# Patient Record
Sex: Male | Born: 1965 | Race: White | Marital: Married | Smoking: Former smoker
Health system: Southern US, Community
[De-identification: ages and names within clinical notes are randomized; demographics above are authoritative.]

## PROBLEM LIST (undated history)

## (undated) DIAGNOSIS — B20 Human immunodeficiency virus [HIV] disease: Principal | ICD-10-CM

## (undated) DIAGNOSIS — M545 Low back pain, unspecified: Secondary | ICD-10-CM

## (undated) DIAGNOSIS — Z789 Other specified health status: Secondary | ICD-10-CM

## (undated) DIAGNOSIS — R85610 Atypical squamous cells of undetermined significance on cytologic smear of anus (ASC-US): Secondary | ICD-10-CM

## (undated) DIAGNOSIS — Z79899 Other long term (current) drug therapy: Secondary | ICD-10-CM

## (undated) DIAGNOSIS — R195 Other fecal abnormalities: Principal | ICD-10-CM

## (undated) DIAGNOSIS — G4700 Insomnia, unspecified: Secondary | ICD-10-CM

## (undated) DIAGNOSIS — Z113 Encounter for screening for infections with a predominantly sexual mode of transmission: Secondary | ICD-10-CM

## (undated) DIAGNOSIS — N486 Induration penis plastica: Secondary | ICD-10-CM

## (undated) DIAGNOSIS — R0981 Nasal congestion: Secondary | ICD-10-CM

## (undated) DIAGNOSIS — M25532 Pain in left wrist: Principal | ICD-10-CM

## (undated) DIAGNOSIS — M79602 Pain in left arm: Secondary | ICD-10-CM

## (undated) DIAGNOSIS — R42 Dizziness and giddiness: Secondary | ICD-10-CM

## (undated) DIAGNOSIS — N529 Male erectile dysfunction, unspecified: Secondary | ICD-10-CM

## (undated) HISTORY — DX: Pain in left wrist: M25.532

## (undated) HISTORY — DX: Insomnia, unspecified: G47.00

## (undated) HISTORY — DX: Atypical squamous cells of undetermined significance on cytologic smear of anus (ASC-US): R85.610

## (undated) HISTORY — DX: Low back pain: M54.5

## (undated) HISTORY — DX: Pain in left arm: M79.602

## (undated) HISTORY — DX: Dizziness and giddiness: R42

## (undated) HISTORY — DX: Other long term (current) drug therapy: Z79.899

## (undated) HISTORY — DX: Other fecal abnormalities: R19.5

## (undated) HISTORY — DX: Human immunodeficiency virus (HIV) disease: B20

## (undated) HISTORY — DX: Other specified health status: Z78.9

## (undated) HISTORY — DX: Nasal congestion: R09.81

## (undated) HISTORY — DX: Encounter for screening for infections with a predominantly sexual mode of transmission: Z11.3

## (undated) HISTORY — DX: Induration penis plastica: N48.6

## (undated) HISTORY — DX: Male erectile dysfunction, unspecified: N52.9

## (undated) HISTORY — DX: Low back pain, unspecified: M54.50

## (undated) MED FILL — Bictegravir-Emtricitabine-Tenofovir AF Tab 50-200-25 MG: ORAL | Fill #0 | Status: CN

---

## 1994-06-16 DIAGNOSIS — B20 Human immunodeficiency virus [HIV] disease: Secondary | ICD-10-CM | POA: Insufficient documentation

## 2014-03-01 DIAGNOSIS — K6282 Dysplasia of anus: Secondary | ICD-10-CM | POA: Insufficient documentation

## 2014-09-15 HISTORY — PX: OTHER SURGICAL HISTORY: SHX169

## 2015-05-29 ENCOUNTER — Other Ambulatory Visit: Payer: Self-pay

## 2015-05-29 DIAGNOSIS — B20 Human immunodeficiency virus [HIV] disease: Secondary | ICD-10-CM

## 2015-06-12 ENCOUNTER — Other Ambulatory Visit (INDEPENDENT_AMBULATORY_CARE_PROVIDER_SITE_OTHER): Payer: Self-pay

## 2015-06-12 DIAGNOSIS — Z79899 Other long term (current) drug therapy: Secondary | ICD-10-CM

## 2015-06-12 DIAGNOSIS — Z113 Encounter for screening for infections with a predominantly sexual mode of transmission: Secondary | ICD-10-CM

## 2015-06-12 DIAGNOSIS — B20 Human immunodeficiency virus [HIV] disease: Secondary | ICD-10-CM

## 2015-06-12 LAB — COMPLETE METABOLIC PANEL WITH GFR
ALBUMIN: 4.7 g/dL (ref 3.6–5.1)
ALK PHOS: 64 U/L (ref 40–115)
ALT: 29 U/L (ref 9–46)
AST: 22 U/L (ref 10–40)
BILIRUBIN TOTAL: 0.5 mg/dL (ref 0.2–1.2)
BUN: 16 mg/dL (ref 7–25)
CALCIUM: 10.2 mg/dL (ref 8.6–10.3)
CO2: 27 mmol/L (ref 20–31)
CREATININE: 1.16 mg/dL (ref 0.60–1.35)
Chloride: 105 mmol/L (ref 98–110)
GFR, EST AFRICAN AMERICAN: 85 mL/min (ref >=60)
GFR, EST NON AFRICAN AMERICAN: 74 mL/min (ref >=60)
Glucose, Bld: 85 mg/dL (ref 65–99)
Potassium: 4.9 mmol/L (ref 3.5–5.3)
Sodium: 141 mmol/L (ref 135–146)
TOTAL PROTEIN: 7.4 g/dL (ref 6.1–8.1)

## 2015-06-12 LAB — CBC WITH DIFFERENTIAL/PLATELET
BASOS ABS: 0.1 10*3/uL (ref 0.0–0.1)
Basophils Relative: 1 % (ref 0–1)
EOS ABS: 0.3 10*3/uL (ref 0.0–0.7)
Eosinophils Relative: 5 % (ref 0–5)
HEMATOCRIT: 50.3 % (ref 39.0–52.0)
Hemoglobin: 17.3 g/dL — ABNORMAL HIGH (ref 13.0–17.0)
LYMPHS ABS: 1.8 10*3/uL (ref 0.7–4.0)
LYMPHS PCT: 32 % (ref 12–46)
MCH: 30.9 pg (ref 26.0–34.0)
MCHC: 34.4 g/dL (ref 30.0–36.0)
MCV: 90 fL (ref 78.0–100.0)
MONOS PCT: 8 % (ref 3–12)
MPV: 9.6 fL (ref 8.6–12.4)
Monocytes Absolute: 0.4 10*3/uL (ref 0.1–1.0)
NEUTROS PCT: 54 % (ref 43–77)
Neutro Abs: 3 10*3/uL (ref 1.7–7.7)
Platelets: 287 10*3/uL (ref 150–400)
RBC: 5.59 MIL/uL (ref 4.22–5.81)
RDW: 13.2 % (ref 11.5–15.5)
WBC: 5.5 10*3/uL (ref 4.0–10.5)

## 2015-06-12 LAB — LIPID PANEL
CHOLESTEROL: 181 mg/dL (ref 125–200)
HDL: 43 mg/dL (ref >=40)
LDL CALC: 93 mg/dL (ref <130)
TRIGLYCERIDES: 224 mg/dL — AB (ref <150)
Total CHOL/HDL Ratio: 4.2 Ratio (ref <=5.0)
VLDL: 45 mg/dL — ABNORMAL HIGH (ref <30)

## 2015-06-13 LAB — URINALYSIS
Bilirubin Urine: NEGATIVE
Glucose, UA: NEGATIVE
Hgb urine dipstick: NEGATIVE
Ketones, ur: NEGATIVE
LEUKOCYTES UA: NEGATIVE
NITRITE: NEGATIVE
PH: 6.5 (ref 5.0–8.0)
Protein, ur: NEGATIVE
SPECIFIC GRAVITY, URINE: 1.018 (ref 1.001–1.035)

## 2015-06-13 LAB — HEPATITIS B SURFACE ANTIGEN: Hepatitis B Surface Ag: NEGATIVE

## 2015-06-13 LAB — HEPATITIS B CORE ANTIBODY, TOTAL: Hep B Core Total Ab: NONREACTIVE

## 2015-06-13 LAB — HEPATITIS C ANTIBODY: HCV AB: NEGATIVE

## 2015-06-13 LAB — HIV-1 RNA ULTRAQUANT REFLEX TO GENTYP+
HIV 1 RNA Quant: 30 copies/mL — ABNORMAL HIGH (ref <20)
HIV-1 RNA Quant, Log: 1.48 Log copies/mL — ABNORMAL HIGH (ref <1.30)

## 2015-06-13 LAB — HEPATITIS A ANTIBODY, TOTAL: Hep A Total Ab: REACTIVE — AB

## 2015-06-13 LAB — SYPHILIS: RPR W/REFLEX TO RPR TITER AND TREPONEMAL ANTIBODIES, TRADITIONAL SCREENING AND DIAGNOSIS ALGORITHM

## 2015-06-13 LAB — T-HELPER CELL (CD4) - (RCID CLINIC ONLY)
CD4 % Helper T Cell: 38 % (ref 33–55)
CD4 T Cell Abs: 660 /uL (ref 400–2700)

## 2015-06-13 LAB — HEPATITIS B SURFACE ANTIBODY,QUALITATIVE: Hep B S Ab: POSITIVE — AB

## 2015-06-14 LAB — QUANTIFERON TB GOLD ASSAY (BLOOD)
Interferon Gamma Release Assay: POSITIVE — AB
Mitogen value: >10 IU/mL
QUANTIFERON TB AG MINUS NIL: 6.62 [IU]/mL
Quantiferon Nil Value: 0.03 IU/mL
TB AG VALUE: 6.65 [IU]/mL

## 2015-06-16 LAB — URINE CYTOLOGY ANCILLARY ONLY
Chlamydia: NEGATIVE
Neisseria Gonorrhea: NEGATIVE

## 2015-06-17 LAB — HLA B*5701: HLA-B*5701 w/rflx HLA-B High: NEGATIVE

## 2015-06-25 ENCOUNTER — Ambulatory Visit: Payer: Self-pay | Admitting: Infectious Disease

## 2015-07-03 ENCOUNTER — Encounter: Payer: Self-pay | Admitting: Infectious Disease

## 2015-07-03 ENCOUNTER — Ambulatory Visit (INDEPENDENT_AMBULATORY_CARE_PROVIDER_SITE_OTHER): Payer: Self-pay | Admitting: Infectious Disease

## 2015-07-03 ENCOUNTER — Encounter: Payer: Self-pay | Admitting: *Deleted

## 2015-07-03 VITALS — BP 145/94 | HR 94 | Temp 97.9°F | Ht 69.69 in | Wt 205.0 lb

## 2015-07-03 DIAGNOSIS — R896 Abnormal cytological findings in specimens from other organs, systems and tissues: Secondary | ICD-10-CM

## 2015-07-03 DIAGNOSIS — M545 Low back pain, unspecified: Secondary | ICD-10-CM | POA: Insufficient documentation

## 2015-07-03 DIAGNOSIS — B20 Human immunodeficiency virus [HIV] disease: Secondary | ICD-10-CM

## 2015-07-03 DIAGNOSIS — G8929 Other chronic pain: Secondary | ICD-10-CM

## 2015-07-03 DIAGNOSIS — M5440 Lumbago with sciatica, unspecified side: Secondary | ICD-10-CM

## 2015-07-03 DIAGNOSIS — G4700 Insomnia, unspecified: Secondary | ICD-10-CM | POA: Insufficient documentation

## 2015-07-03 DIAGNOSIS — R85610 Atypical squamous cells of undetermined significance on cytologic smear of anus (ASC-US): Secondary | ICD-10-CM

## 2015-07-03 DIAGNOSIS — N486 Induration penis plastica: Secondary | ICD-10-CM | POA: Insufficient documentation

## 2015-07-03 DIAGNOSIS — K6282 Dysplasia of anus: Secondary | ICD-10-CM

## 2015-07-03 HISTORY — DX: Human immunodeficiency virus (HIV) disease: B20

## 2015-07-03 HISTORY — DX: Atypical squamous cells of undetermined significance on cytologic smear of anus (ASC-US): R85.610

## 2015-07-03 MED ORDER — CYCLOBENZAPRINE HCL 10 MG PO TABS: 10.0000 mg | ORAL_TABLET | Freq: Every evening | ORAL | Status: DC | PRN

## 2015-07-03 MED ORDER — ELVITEG-COBIC-EMTRICIT-TENOFAF 150-150-200-10 MG PO TABS: 1.0000 | ORAL_TABLET | Freq: Every day | ORAL | Status: DC

## 2015-07-03 MED ORDER — PENTOXIFYLLINE ER 400 MG PO TBCR: 400.0000 mg | EXTENDED_RELEASE_TABLET | Freq: Three times a day (TID) | ORAL | Status: DC
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
## 2015-07-03 NOTE — Patient Instructions (Signed)
We will refer you to Dr. Ninetta Lights for the HRA clinic (front desk please arrange for pt to be seen in Dr. Tyrone Apple HRA clinic)  We will try to get you an appt with a Urologist, probably it will need to be [facility] or [facility]  We will ensure ADAP has been done for new period thru September  We will work on obtaining the [facility] and cyclobenzaprine for you

## 2015-07-04 ENCOUNTER — Other Ambulatory Visit: Payer: Self-pay | Admitting: *Deleted

## 2015-07-04 DIAGNOSIS — B20 Human immunodeficiency virus [HIV] disease: Secondary | ICD-10-CM

## 2015-07-04 MED ORDER — CYCLOBENZAPRINE HCL 10 MG PO TABS: 10.0000 mg | ORAL_TABLET | Freq: Every evening | ORAL | Status: DC | PRN

## 2015-07-04 MED ORDER — ELVITEG-COBIC-EMTRICIT-TENOFAF 150-150-200-10 MG PO TABS: 1.0000 | ORAL_TABLET | Freq: Every day | ORAL | Status: DC

## 2015-07-25 ENCOUNTER — Other Ambulatory Visit: Payer: Self-pay | Admitting: *Deleted

## 2015-07-25 DIAGNOSIS — B20 Human immunodeficiency virus [HIV] disease: Secondary | ICD-10-CM

## 2015-07-25 MED ORDER — ELVITEG-COBIC-EMTRICIT-TENOFAF 150-150-200-10 MG PO TABS: 1.0000 | ORAL_TABLET | Freq: Every day | ORAL | Status: DC

## 2015-08-31 ENCOUNTER — Other Ambulatory Visit (INDEPENDENT_AMBULATORY_CARE_PROVIDER_SITE_OTHER): Payer: Self-pay

## 2015-08-31 DIAGNOSIS — B20 Human immunodeficiency virus [HIV] disease: Secondary | ICD-10-CM

## 2015-08-31 LAB — CBC WITH DIFFERENTIAL/PLATELET
BASOS ABS: 0.1 10*3/uL (ref 0.0–0.1)
BASOS PCT: 1 % (ref 0–1)
Eosinophils Absolute: 0.5 10*3/uL (ref 0.0–0.7)
Eosinophils Relative: 9 % — ABNORMAL HIGH (ref 0–5)
HCT: 50.3 % (ref 39.0–52.0)
HEMOGLOBIN: 16.8 g/dL (ref 13.0–17.0)
Lymphocytes Relative: 37 % (ref 12–46)
Lymphs Abs: 2 10*3/uL (ref 0.7–4.0)
MCH: 30.5 pg (ref 26.0–34.0)
MCHC: 33.4 g/dL (ref 30.0–36.0)
MCV: 91.3 fL (ref 78.0–100.0)
MONO ABS: 0.5 10*3/uL (ref 0.1–1.0)
MPV: 9.6 fL (ref 8.6–12.4)
Monocytes Relative: 9 % (ref 3–12)
NEUTROS ABS: 2.4 10*3/uL (ref 1.7–7.7)
Neutrophils Relative %: 44 % (ref 43–77)
Platelets: 291 10*3/uL (ref 150–400)
RBC: 5.51 MIL/uL (ref 4.22–5.81)
RDW: 13.4 % (ref 11.5–15.5)
WBC: 5.5 10*3/uL (ref 4.0–10.5)

## 2015-08-31 LAB — COMPLETE METABOLIC PANEL WITH GFR
ALBUMIN: 4.8 g/dL (ref 3.6–5.1)
ALK PHOS: 59 U/L (ref 40–115)
ALT: 56 U/L — ABNORMAL HIGH (ref 9–46)
AST: 35 U/L (ref 10–40)
BUN: 15 mg/dL (ref 7–25)
CO2: 29 mmol/L (ref 20–31)
Calcium: 10.2 mg/dL (ref 8.6–10.3)
Chloride: 104 mmol/L (ref 98–110)
Creat: 1.08 mg/dL (ref 0.60–1.35)
GFR, Est African American: >89 mL/min (ref >=60)
GFR, Est Non African American: 80 mL/min (ref >=60)
GLUCOSE: 93 mg/dL (ref 65–99)
POTASSIUM: 5 mmol/L (ref 3.5–5.3)
SODIUM: 140 mmol/L (ref 135–146)
Total Bilirubin: 0.5 mg/dL (ref 0.2–1.2)
Total Protein: 7.2 g/dL (ref 6.1–8.1)

## 2015-08-31 LAB — T-HELPER CELL (CD4) - (RCID CLINIC ONLY)
CD4 T CELL ABS: 740 /uL (ref 400–2700)
CD4 T CELL HELPER: 34 % (ref 33–55)

## 2015-09-01 LAB — RPR

## 2015-09-04 LAB — HIV-1 RNA QUANT-NO REFLEX-BLD

## 2015-09-11 ENCOUNTER — Encounter: Payer: Self-pay | Admitting: Infectious Disease

## 2015-09-11 ENCOUNTER — Ambulatory Visit (INDEPENDENT_AMBULATORY_CARE_PROVIDER_SITE_OTHER): Payer: Self-pay | Admitting: Infectious Disease

## 2015-09-11 VITALS — BP 130/90 | HR 79 | Temp 98.1°F | Ht 69.69 in | Wt 201.0 lb

## 2015-09-11 DIAGNOSIS — R0981 Nasal congestion: Secondary | ICD-10-CM

## 2015-09-11 DIAGNOSIS — B20 Human immunodeficiency virus [HIV] disease: Secondary | ICD-10-CM

## 2015-09-11 DIAGNOSIS — K6282 Dysplasia of anus: Secondary | ICD-10-CM

## 2015-09-11 DIAGNOSIS — R85610 Atypical squamous cells of undetermined significance on cytologic smear of anus (ASC-US): Secondary | ICD-10-CM

## 2015-09-11 DIAGNOSIS — M5440 Lumbago with sciatica, unspecified side: Secondary | ICD-10-CM

## 2015-09-11 DIAGNOSIS — Z21 Asymptomatic human immunodeficiency virus [HIV] infection status: Secondary | ICD-10-CM

## 2015-09-11 DIAGNOSIS — M79602 Pain in left arm: Secondary | ICD-10-CM

## 2015-09-11 DIAGNOSIS — G8929 Other chronic pain: Secondary | ICD-10-CM

## 2015-09-11 DIAGNOSIS — R896 Abnormal cytological findings in specimens from other organs, systems and tissues: Secondary | ICD-10-CM

## 2015-09-11 DIAGNOSIS — N486 Induration penis plastica: Secondary | ICD-10-CM

## 2015-09-11 HISTORY — DX: Nasal congestion: R09.81

## 2015-09-11 HISTORY — DX: Pain in left arm: M79.602

## 2015-09-11 MED ORDER — TRAZODONE HCL 50 MG PO TABS: 50.0000 mg | ORAL_TABLET | Freq: Every day | ORAL | Status: DC

## 2015-09-11 MED ORDER — PENTOXIFYLLINE ER 400 MG PO TBCR: 400.0000 mg | EXTENDED_RELEASE_TABLET | Freq: Three times a day (TID) | ORAL | Status: DC
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
## 2015-09-12 ENCOUNTER — Ambulatory Visit: Payer: Self-pay

## 2015-10-10 ENCOUNTER — Ambulatory Visit: Payer: Self-pay | Attending: Internal Medicine

## 2015-10-26 ENCOUNTER — Encounter: Payer: Self-pay | Admitting: Family Medicine

## 2015-10-26 ENCOUNTER — Ambulatory Visit (INDEPENDENT_AMBULATORY_CARE_PROVIDER_SITE_OTHER): Payer: Self-pay | Admitting: Family Medicine

## 2015-10-26 VITALS — BP 135/76 | HR 74 | Temp 97.8°F | Resp 18 | Ht 69.0 in | Wt 211.0 lb

## 2015-10-26 DIAGNOSIS — K6282 Dysplasia of anus: Secondary | ICD-10-CM

## 2015-10-26 DIAGNOSIS — Z7689 Persons encountering health services in other specified circumstances: Secondary | ICD-10-CM

## 2015-10-26 DIAGNOSIS — Z7189 Other specified counseling: Secondary | ICD-10-CM
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
## 2015-10-26 NOTE — Patient Instructions (Signed)
Will investigate to see where we might send you about the anal dysplasia.

## 2015-10-26 NOTE — Progress Notes (Signed)
Patient is here to establish care  Patient denies pain at this time.  Patient has taken medication and patient has eaten today.

## 2015-12-27 ENCOUNTER — Other Ambulatory Visit (INDEPENDENT_AMBULATORY_CARE_PROVIDER_SITE_OTHER): Payer: Self-pay

## 2015-12-27 DIAGNOSIS — Z113 Encounter for screening for infections with a predominantly sexual mode of transmission: Secondary | ICD-10-CM

## 2015-12-27 DIAGNOSIS — Z79899 Other long term (current) drug therapy: Secondary | ICD-10-CM

## 2015-12-27 DIAGNOSIS — B20 Human immunodeficiency virus [HIV] disease: Secondary | ICD-10-CM

## 2015-12-27 LAB — COMPLETE METABOLIC PANEL WITH GFR
ALBUMIN: 4.3 g/dL (ref 3.6–5.1)
ALT: 22 U/L (ref 9–46)
AST: 23 U/L (ref 10–40)
Alkaline Phosphatase: 70 U/L (ref 40–115)
BILIRUBIN TOTAL: 0.6 mg/dL (ref 0.2–1.2)
BUN: 17 mg/dL (ref 7–25)
CALCIUM: 9.2 mg/dL (ref 8.6–10.3)
CHLORIDE: 107 mmol/L (ref 98–110)
CO2: 25 mmol/L (ref 20–31)
CREATININE: 1.07 mg/dL (ref 0.60–1.35)
GFR, Est Non African American: 81 mL/min (ref >=60)
Glucose, Bld: 63 mg/dL — ABNORMAL LOW (ref 65–99)
Potassium: 3.9 mmol/L (ref 3.5–5.3)
Sodium: 141 mmol/L (ref 135–146)
TOTAL PROTEIN: 6.3 g/dL (ref 6.1–8.1)

## 2015-12-27 LAB — CBC WITH DIFFERENTIAL/PLATELET
BASOS ABS: 0 {cells}/uL (ref 0–200)
BASOS PCT: 0 %
EOS PCT: 10 %
Eosinophils Absolute: 490 cells/uL (ref 15–500)
HCT: 48.2 % (ref 38.5–50.0)
HEMOGLOBIN: 16.4 g/dL (ref 13.2–17.1)
LYMPHS ABS: 1421 {cells}/uL (ref 850–3900)
Lymphocytes Relative: 29 %
MCH: 30.3 pg (ref 27.0–33.0)
MCHC: 34 g/dL (ref 32.0–36.0)
MCV: 88.9 fL (ref 80.0–100.0)
MPV: 9.5 fL (ref 7.5–12.5)
Monocytes Absolute: 588 cells/uL (ref 200–950)
Monocytes Relative: 12 %
NEUTROS ABS: 2401 {cells}/uL (ref 1500–7800)
Neutrophils Relative %: 49 %
PLATELETS: 239 10*3/uL (ref 140–400)
RBC: 5.42 MIL/uL (ref 4.20–5.80)
RDW: 13.3 % (ref 11.0–15.0)
WBC: 4.9 10*3/uL (ref 3.8–10.8)

## 2015-12-27 LAB — LIPID PANEL
Cholesterol: 137 mg/dL (ref 125–200)
HDL: 34 mg/dL — AB (ref >=40)
LDL CALC: 68 mg/dL (ref <130)
Total CHOL/HDL Ratio: 4 Ratio (ref <=5.0)
Triglycerides: 173 mg/dL — ABNORMAL HIGH (ref <150)
VLDL: 35 mg/dL — ABNORMAL HIGH (ref <30)

## 2015-12-27 NOTE — Addendum Note (Signed)
Addended byJimmy Picket: ABBITT, KATRINA F on: 12/27/2015 03:11 PM   Modules accepted: Orders

## 2015-12-28 LAB — MICROALBUMIN / CREATININE URINE RATIO
Creatinine, Urine: 234 mg/dL (ref 20–370)
MICROALB/CREAT RATIO: 3 ug/mg{creat} (ref <30)
Microalb, Ur: 0.6 mg/dL

## 2015-12-28 LAB — T-HELPER CELL (CD4) - (RCID CLINIC ONLY)
CD4 % Helper T Cell: 32 % — ABNORMAL LOW (ref 33–55)
CD4 T Cell Abs: 520 /uL (ref 400–2700)

## 2015-12-28 LAB — RPR

## 2015-12-28 LAB — HIV-1 RNA QUANT-NO REFLEX-BLD
HIV 1 RNA Quant: 32 copies/mL — ABNORMAL HIGH (ref <20)
HIV-1 RNA QUANT, LOG: 1.51 {Log_copies}/mL — AB (ref <1.30)

## 2016-01-03 ENCOUNTER — Encounter: Payer: Self-pay | Admitting: Infectious Disease

## 2016-01-10 ENCOUNTER — Ambulatory Visit (INDEPENDENT_AMBULATORY_CARE_PROVIDER_SITE_OTHER): Payer: Self-pay | Admitting: Infectious Disease

## 2016-01-10 VITALS — BP 116/76 | HR 80 | Temp 98.0°F | Wt 190.0 lb

## 2016-01-10 DIAGNOSIS — K6282 Dysplasia of anus: Secondary | ICD-10-CM

## 2016-01-10 DIAGNOSIS — R896 Abnormal cytological findings in specimens from other organs, systems and tissues: Secondary | ICD-10-CM

## 2016-01-10 DIAGNOSIS — B20 Human immunodeficiency virus [HIV] disease: Secondary | ICD-10-CM

## 2016-01-10 DIAGNOSIS — Z21 Asymptomatic human immunodeficiency virus [HIV] infection status: Secondary | ICD-10-CM

## 2016-01-10 DIAGNOSIS — N486 Induration penis plastica: Secondary | ICD-10-CM

## 2016-01-10 DIAGNOSIS — M5440 Lumbago with sciatica, unspecified side: Secondary | ICD-10-CM

## 2016-01-10 DIAGNOSIS — R85610 Atypical squamous cells of undetermined significance on cytologic smear of anus (ASC-US): Secondary | ICD-10-CM
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
## 2016-06-24 ENCOUNTER — Ambulatory Visit: Payer: Self-pay | Admitting: Infectious Disease

## 2016-06-30 ENCOUNTER — Encounter: Payer: Self-pay | Admitting: Infectious Disease

## 2016-06-30 ENCOUNTER — Ambulatory Visit (INDEPENDENT_AMBULATORY_CARE_PROVIDER_SITE_OTHER): Payer: Self-pay | Admitting: Infectious Disease

## 2016-06-30 ENCOUNTER — Other Ambulatory Visit (INDEPENDENT_AMBULATORY_CARE_PROVIDER_SITE_OTHER): Payer: Self-pay

## 2016-06-30 DIAGNOSIS — Z113 Encounter for screening for infections with a predominantly sexual mode of transmission: Secondary | ICD-10-CM

## 2016-06-30 DIAGNOSIS — B20 Human immunodeficiency virus [HIV] disease: Secondary | ICD-10-CM

## 2016-06-30 DIAGNOSIS — M25532 Pain in left wrist: Secondary | ICD-10-CM

## 2016-06-30 DIAGNOSIS — Z79899 Other long term (current) drug therapy: Secondary | ICD-10-CM

## 2016-06-30 DIAGNOSIS — Z789 Other specified health status: Secondary | ICD-10-CM

## 2016-06-30 HISTORY — DX: Encounter for screening for infections with a predominantly sexual mode of transmission: Z11.3

## 2016-06-30 HISTORY — DX: Other specified health status: Z78.9

## 2016-06-30 HISTORY — DX: Other long term (current) drug therapy: Z79.899

## 2016-06-30 HISTORY — DX: Pain in left wrist: M25.532

## 2016-06-30 LAB — CBC WITH DIFFERENTIAL/PLATELET
Basophils Absolute: 0 cells/uL (ref 0–200)
Basophils Relative: 0 %
Eosinophils Absolute: 288 cells/uL (ref 15–500)
Eosinophils Relative: 6 %
HCT: 48.2 % (ref 38.5–50.0)
Hemoglobin: 16.3 g/dL (ref 13.2–17.1)
Lymphocytes Relative: 34 %
Lymphs Abs: 1632 cells/uL (ref 850–3900)
MCH: 30.1 pg (ref 27.0–33.0)
MCHC: 33.8 g/dL (ref 32.0–36.0)
MCV: 89.1 fL (ref 80.0–100.0)
MPV: 9.2 fL (ref 7.5–12.5)
Monocytes Absolute: 480 cells/uL (ref 200–950)
Monocytes Relative: 10 %
Neutro Abs: 2400 cells/uL (ref 1500–7800)
Neutrophils Relative %: 50 %
Platelets: 262 10*3/uL (ref 140–400)
RBC: 5.41 MIL/uL (ref 4.20–5.80)
RDW: 13.1 % (ref 11.0–15.0)
WBC: 4.8 10*3/uL (ref 3.8–10.8)

## 2016-06-30 LAB — LIPID PANEL
Cholesterol: 193 mg/dL (ref <200)
HDL: 39 mg/dL — ABNORMAL LOW (ref >40)
LDL Cholesterol: 109 mg/dL — ABNORMAL HIGH (ref <100)
TRIGLYCERIDES: 226 mg/dL — AB (ref <150)
Total CHOL/HDL Ratio: 4.9 Ratio (ref <5.0)
VLDL: 45 mg/dL — ABNORMAL HIGH (ref <30)

## 2016-06-30 LAB — COMPLETE METABOLIC PANEL WITH GFR
ALBUMIN: 4.3 g/dL (ref 3.6–5.1)
ALK PHOS: 61 U/L (ref 40–115)
ALT: 16 U/L (ref 9–46)
AST: 15 U/L (ref 10–35)
BILIRUBIN TOTAL: 0.3 mg/dL (ref 0.2–1.2)
BUN: 13 mg/dL (ref 7–25)
CALCIUM: 9.2 mg/dL (ref 8.6–10.3)
CO2: 26 mmol/L (ref 20–31)
Chloride: 107 mmol/L (ref 98–110)
Creat: 1.01 mg/dL (ref 0.70–1.33)
GFR, EST NON AFRICAN AMERICAN: 86 mL/min (ref >=60)
GFR, Est African American: >89 mL/min (ref >=60)
Glucose, Bld: 87 mg/dL (ref 65–99)
Potassium: 4.5 mmol/L (ref 3.5–5.3)
SODIUM: 139 mmol/L (ref 135–146)
TOTAL PROTEIN: 7 g/dL (ref 6.1–8.1)
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
## 2016-07-01 LAB — MICROALBUMIN / CREATININE URINE RATIO
Creatinine, Urine: 112 mg/dL (ref 20–370)
MICROALB/CREAT RATIO: 4 ug/mg{creat} (ref <30)
Microalb, Ur: 0.4 mg/dL

## 2016-07-01 LAB — RPR

## 2016-07-01 LAB — T-HELPER CELL (CD4) - (RCID CLINIC ONLY)
CD4 % Helper T Cell: 37 % (ref 33–55)
CD4 T Cell Abs: 710 /uL (ref 400–2700)

## 2016-07-04 LAB — HIV-1 RNA QUANT-NO REFLEX-BLD
HIV 1 RNA QUANT: NOT DETECTED {copies}/mL (ref <20)
HIV-1 RNA QUANT, LOG: NOT DETECTED {Log_copies}/mL (ref <1.30)

## 2016-07-07 ENCOUNTER — Encounter: Payer: Self-pay | Admitting: Infectious Disease

## 2016-07-10 ENCOUNTER — Other Ambulatory Visit: Payer: Self-pay | Admitting: Infectious Disease

## 2016-07-10 DIAGNOSIS — B20 Human immunodeficiency virus [HIV] disease: Secondary | ICD-10-CM

## 2016-07-14 ENCOUNTER — Ambulatory Visit: Payer: Self-pay | Admitting: Infectious Disease

## 2016-08-06 ENCOUNTER — Other Ambulatory Visit: Payer: Self-pay | Admitting: Infectious Disease

## 2016-08-06 DIAGNOSIS — G4700 Insomnia, unspecified: Secondary | ICD-10-CM

## 2016-09-23 ENCOUNTER — Other Ambulatory Visit (INDEPENDENT_AMBULATORY_CARE_PROVIDER_SITE_OTHER): Payer: Self-pay

## 2016-09-23 DIAGNOSIS — B20 Human immunodeficiency virus [HIV] disease: Secondary | ICD-10-CM

## 2016-09-23 DIAGNOSIS — Z79899 Other long term (current) drug therapy: Secondary | ICD-10-CM

## 2016-09-23 DIAGNOSIS — Z113 Encounter for screening for infections with a predominantly sexual mode of transmission: Secondary | ICD-10-CM

## 2016-09-23 LAB — CBC WITH DIFFERENTIAL/PLATELET
BASOS PCT: 0 %
Basophils Absolute: 0 cells/uL (ref 0–200)
EOS ABS: 420 {cells}/uL (ref 15–500)
Eosinophils Relative: 7 %
HEMATOCRIT: 44.2 % (ref 38.5–50.0)
Hemoglobin: 14.9 g/dL (ref 13.2–17.1)
LYMPHS ABS: 1680 {cells}/uL (ref 850–3900)
Lymphocytes Relative: 28 %
MCH: 30 pg (ref 27.0–33.0)
MCHC: 33.7 g/dL (ref 32.0–36.0)
MCV: 88.9 fL (ref 80.0–100.0)
MONO ABS: 540 {cells}/uL (ref 200–950)
MPV: 9.6 fL (ref 7.5–12.5)
Monocytes Relative: 9 %
Neutro Abs: 3360 cells/uL (ref 1500–7800)
Neutrophils Relative %: 56 %
Platelets: 255 10*3/uL (ref 140–400)
RBC: 4.97 MIL/uL (ref 4.20–5.80)
RDW: 13.9 % (ref 11.0–15.0)
WBC: 6 10*3/uL (ref 3.8–10.8)

## 2016-09-23 LAB — COMPREHENSIVE METABOLIC PANEL
ALBUMIN: 4.4 g/dL (ref 3.6–5.1)
ALK PHOS: 65 U/L (ref 40–115)
ALT: 16 U/L (ref 9–46)
AST: 18 U/L (ref 10–35)
BUN: 12 mg/dL (ref 7–25)
CALCIUM: 9.3 mg/dL (ref 8.6–10.3)
CHLORIDE: 107 mmol/L (ref 98–110)
CO2: 26 mmol/L (ref 20–31)
Creat: 1.07 mg/dL (ref 0.70–1.33)
Glucose, Bld: 77 mg/dL (ref 65–99)
Potassium: 4 mmol/L (ref 3.5–5.3)
Sodium: 141 mmol/L (ref 135–146)
TOTAL PROTEIN: 6.7 g/dL (ref 6.1–8.1)
Total Bilirubin: 0.4 mg/dL (ref 0.2–1.2)

## 2016-09-23 LAB — LIPID PANEL
CHOLESTEROL: 161 mg/dL (ref <200)
HDL: 40 mg/dL — AB (ref >40)
LDL Cholesterol: 72 mg/dL (ref <100)
Total CHOL/HDL Ratio: 4 Ratio (ref <5.0)
Triglycerides: 243 mg/dL — ABNORMAL HIGH (ref <150)
VLDL: 49 mg/dL — ABNORMAL HIGH (ref <30)

## 2016-09-24 LAB — T-HELPER CELL (CD4) - (RCID CLINIC ONLY)
CD4 % Helper T Cell: 37 % (ref 33–55)
CD4 T Cell Abs: 670 /uL (ref 400–2700)

## 2016-09-25 LAB — HIV-1 RNA QUANT-NO REFLEX-BLD
HIV 1 RNA QUANT: DETECTED {copies}/mL — AB
HIV-1 RNA QUANT, LOG: DETECTED {Log_copies}/mL — AB

## 2016-10-07 ENCOUNTER — Ambulatory Visit (INDEPENDENT_AMBULATORY_CARE_PROVIDER_SITE_OTHER): Payer: Self-pay | Admitting: Infectious Disease

## 2016-10-07 ENCOUNTER — Encounter: Payer: Self-pay | Admitting: Infectious Disease

## 2016-10-07 VITALS — BP 137/88 | HR 89 | Temp 98.1°F | Wt 203.0 lb

## 2016-10-07 DIAGNOSIS — R195 Other fecal abnormalities: Secondary | ICD-10-CM

## 2016-10-07 DIAGNOSIS — B20 Human immunodeficiency virus [HIV] disease: Secondary | ICD-10-CM

## 2016-10-07 DIAGNOSIS — R85610 Atypical squamous cells of undetermined significance on cytologic smear of anus (ASC-US): Secondary | ICD-10-CM

## 2016-10-07 DIAGNOSIS — Z79899 Other long term (current) drug therapy: Secondary | ICD-10-CM

## 2016-10-07 DIAGNOSIS — N486 Induration penis plastica: Secondary | ICD-10-CM

## 2016-10-07 DIAGNOSIS — Z113 Encounter for screening for infections with a predominantly sexual mode of transmission: Secondary | ICD-10-CM

## 2016-10-07 HISTORY — DX: Other fecal abnormalities: R19.5
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
## 2016-10-08 LAB — CYTOLOGY, (ORAL, ANAL, URETHRAL) ANCILLARY ONLY
CHLAMYDIA, DNA PROBE: NEGATIVE
Chlamydia: NEGATIVE
NEISSERIA GONORRHEA: NEGATIVE
Neisseria Gonorrhea: NEGATIVE

## 2016-10-08 LAB — URINE CYTOLOGY ANCILLARY ONLY
Chlamydia: NEGATIVE
Neisseria Gonorrhea: NEGATIVE

## 2016-11-17 ENCOUNTER — Other Ambulatory Visit: Payer: Self-pay | Admitting: Infectious Disease

## 2016-11-17 DIAGNOSIS — B20 Human immunodeficiency virus [HIV] disease: Secondary | ICD-10-CM

## 2016-11-17 MED ORDER — BICTEGRAVIR-EMTRICITAB-TENOFOV 50-200-25 MG PO TABS: 1.0000 | ORAL_TABLET | Freq: Every day | ORAL | 11 refills | Status: DC

## 2016-11-17 NOTE — Progress Notes (Signed)
Can he come and visit [facility] pharmacy [facility]

## 2016-11-27 ENCOUNTER — Other Ambulatory Visit: Payer: Self-pay | Admitting: Infectious Disease

## 2016-11-27 DIAGNOSIS — G4700 Insomnia, unspecified: Secondary | ICD-10-CM

## 2016-12-18 ENCOUNTER — Ambulatory Visit (INDEPENDENT_AMBULATORY_CARE_PROVIDER_SITE_OTHER): Payer: Self-pay | Admitting: Pharmacist Clinician (PhC)/ Clinical Pharmacy Specialist

## 2016-12-18 DIAGNOSIS — B20 Human immunodeficiency virus [HIV] disease: Secondary | ICD-10-CM

## 2016-12-18 DIAGNOSIS — R195 Other fecal abnormalities: Secondary | ICD-10-CM

## 2016-12-18 LAB — COMPLETE METABOLIC PANEL WITH GFR
ALBUMIN: 4.7 g/dL (ref 3.6–5.1)
ALK PHOS: 84 U/L (ref 40–115)
ALT: 18 U/L (ref 9–46)
AST: 18 U/L (ref 10–35)
BUN: 14 mg/dL (ref 7–25)
CALCIUM: 9.4 mg/dL (ref 8.6–10.3)
CHLORIDE: 107 mmol/L (ref 98–110)
CO2: 24 mmol/L (ref 20–31)
Creat: 1.14 mg/dL (ref 0.70–1.33)
GFR, EST AFRICAN AMERICAN: 86 mL/min (ref >=60)
GFR, EST NON AFRICAN AMERICAN: 75 mL/min (ref >=60)
Glucose, Bld: 68 mg/dL (ref 65–99)
POTASSIUM: 4.3 mmol/L (ref 3.5–5.3)
Sodium: 141 mmol/L (ref 135–146)
Total Bilirubin: 0.4 mg/dL (ref 0.2–1.2)
Total Protein: 7.3 g/dL (ref 6.1–8.1)

## 2016-12-18 LAB — CBC WITH DIFFERENTIAL/PLATELET
BASOS PCT: 0 %
Basophils Absolute: 0 cells/uL (ref 0–200)
EOS PCT: 5 %
Eosinophils Absolute: 470 cells/uL (ref 15–500)
HEMATOCRIT: 47.9 % (ref 38.5–50.0)
HEMOGLOBIN: 16.4 g/dL (ref 13.2–17.1)
LYMPHS ABS: 2350 {cells}/uL (ref 850–3900)
Lymphocytes Relative: 25 %
MCH: 30.5 pg (ref 27.0–33.0)
MCHC: 34.2 g/dL (ref 32.0–36.0)
MCV: 89.2 fL (ref 80.0–100.0)
MONO ABS: 752 {cells}/uL (ref 200–950)
MPV: 9.6 fL (ref 7.5–12.5)
Monocytes Relative: 8 %
NEUTROS ABS: 5828 {cells}/uL (ref 1500–7800)
NEUTROS PCT: 62 %
Platelets: 269 10*3/uL (ref 140–400)
RBC: 5.37 MIL/uL (ref 4.20–5.80)
RDW: 13.6 % (ref 11.0–15.0)
WBC: 9.4 10*3/uL (ref 3.8–10.8)

## 2016-12-18 NOTE — Patient Instructions (Signed)
F/u with Dr. Daiva EvesVan Dam in Sept Continue your [place]mannBiktarvy
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
## 2016-12-19 LAB — T-HELPER CELL (CD4) - (RCID CLINIC ONLY)
CD4 % Helper T Cell: 31 % — ABNORMAL LOW (ref 33–55)
CD4 T Cell Abs: 750 /uL (ref 400–2700)

## 2016-12-19 LAB — RPR

## 2016-12-22 LAB — HIV-1 RNA QUANT-NO REFLEX-BLD
HIV 1 RNA Quant: 37 copies/mL — ABNORMAL HIGH
HIV-1 RNA Quant, Log: 1.57 Log copies/mL — ABNORMAL HIGH

## 2016-12-23 ENCOUNTER — Encounter: Payer: Self-pay | Admitting: Infectious Disease

## 2017-02-23 ENCOUNTER — Ambulatory Visit (INDEPENDENT_AMBULATORY_CARE_PROVIDER_SITE_OTHER): Payer: Self-pay | Admitting: Pharmacist Clinician (PhC)/ Clinical Pharmacy Specialist

## 2017-02-23 DIAGNOSIS — B20 Human immunodeficiency virus [HIV] disease: Secondary | ICD-10-CM

## 2017-02-23 DIAGNOSIS — Z23 Encounter for immunization: Secondary | ICD-10-CM
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
## 2017-02-23 NOTE — Patient Instructions (Addendum)
F/u with Dr Daiva EvesVan Dam on 12/10 Continue your VaughnBiktarvy

## 2017-02-25 LAB — HIV-1 RNA QUANT-NO REFLEX-BLD
HIV 1 RNA QUANT: 45 {copies}/mL — AB
HIV-1 RNA Quant, Log: 1.65 Log copies/mL — ABNORMAL HIGH

## 2017-03-31 ENCOUNTER — Other Ambulatory Visit: Payer: Self-pay | Admitting: Infectious Disease

## 2017-03-31 DIAGNOSIS — G4700 Insomnia, unspecified: Secondary | ICD-10-CM

## 2017-05-25 ENCOUNTER — Ambulatory Visit: Payer: Self-pay | Admitting: Infectious Disease

## 2017-06-22 ENCOUNTER — Encounter: Payer: Self-pay | Admitting: Infectious Disease

## 2017-06-22 ENCOUNTER — Ambulatory Visit (INDEPENDENT_AMBULATORY_CARE_PROVIDER_SITE_OTHER): Payer: Self-pay | Admitting: Infectious Disease

## 2017-06-22 ENCOUNTER — Ambulatory Visit: Payer: Self-pay

## 2017-06-22 VITALS — BP 127/87 | HR 76 | Temp 98.4°F | Wt 207.0 lb

## 2017-06-22 DIAGNOSIS — Z789 Other specified health status: Secondary | ICD-10-CM

## 2017-06-22 DIAGNOSIS — Z79899 Other long term (current) drug therapy: Secondary | ICD-10-CM

## 2017-06-22 DIAGNOSIS — R85610 Atypical squamous cells of undetermined significance on cytologic smear of anus (ASC-US): Secondary | ICD-10-CM

## 2017-06-22 DIAGNOSIS — Z113 Encounter for screening for infections with a predominantly sexual mode of transmission: Secondary | ICD-10-CM

## 2017-06-22 DIAGNOSIS — B20 Human immunodeficiency virus [HIV] disease: Secondary | ICD-10-CM
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
## 2017-06-24 LAB — HIV-1 RNA QUANT-NO REFLEX-BLD
HIV 1 RNA Quant: <20 copies/mL
HIV-1 RNA QUANT, LOG: NOT DETECTED {Log_copies}/mL

## 2017-07-06 ENCOUNTER — Telehealth: Payer: Self-pay

## 2017-07-06 NOTE — Telephone Encounter (Signed)
Called patient to see if I could make an appointment for upcoming HRA clinic. I was unable to reach the pt and left a message asking for a call back to schedule an appointment for HRA. HRA is 07/10/17 30 min appointments  Lorenso CourierJose L Maldonado, CMA

## 2017-07-07 ENCOUNTER — Other Ambulatory Visit: Payer: Self-pay | Admitting: Infectious Disease

## 2017-07-07 DIAGNOSIS — G4700 Insomnia, unspecified: Secondary | ICD-10-CM

## 2017-07-08 NOTE — Telephone Encounter (Signed)
Patient returned call via [facility] Fam[facility]uel, accepted an appointment 1/25 at 11:00 for HRA. Nurse answered questions via [facility] Fam[facility]uel. Andree CossHowell, Joline Encalada M, RN

## 2017-07-10 ENCOUNTER — Encounter: Payer: Self-pay | Admitting: Infectious Disease

## 2017-07-10 ENCOUNTER — Ambulatory Visit: Payer: Self-pay

## 2017-07-10 ENCOUNTER — Ambulatory Visit (INDEPENDENT_AMBULATORY_CARE_PROVIDER_SITE_OTHER): Payer: Self-pay | Admitting: Infectious Diseases

## 2017-07-10 ENCOUNTER — Other Ambulatory Visit (HOSPITAL_COMMUNITY)
Admission: RE | Admit: 2017-07-10 | Discharge: 2017-07-10 | Disposition: A | Discharge status: Home / Self Care | Payer: 59 | Source: Ambulatory Visit | Attending: Infectious Diseases | Admitting: Infectious Diseases

## 2017-07-10 ENCOUNTER — Other Ambulatory Visit: Payer: Self-pay | Admitting: Infectious Disease

## 2017-07-10 DIAGNOSIS — Z21 Asymptomatic human immunodeficiency virus [HIV] infection status: Secondary | ICD-10-CM | POA: Insufficient documentation

## 2017-07-10 DIAGNOSIS — Z86010 Personal history of colonic polyps: Secondary | ICD-10-CM | POA: Diagnosis not present

## 2017-07-10 DIAGNOSIS — G4700 Insomnia, unspecified: Secondary | ICD-10-CM

## 2017-07-10 DIAGNOSIS — Z79899 Other long term (current) drug therapy: Secondary | ICD-10-CM | POA: Diagnosis not present

## 2017-07-10 DIAGNOSIS — K6282 Dysplasia of anus: Secondary | ICD-10-CM | POA: Diagnosis present

## 2017-07-10 DIAGNOSIS — K635 Polyp of colon: Secondary | ICD-10-CM | POA: Insufficient documentation

## 2017-07-10 NOTE — Assessment & Plan Note (Addendum)
Please see HR notes.

## 2017-07-10 NOTE — Assessment & Plan Note (Signed)
Will refer him to GI

## 2017-07-10 NOTE — Progress Notes (Signed)
   52 yo M with hx of HIV+, on biktarvy, as well as anal dysplasia. He also has a hx of colon polyps (3) in 2014.   HIV 1 RNA Quant (copies/mL)  Date Value  06/22/2017 <20 NOT DETECTED  02/23/2017 45 (H)  12/18/2016 37 (H)   CD4 T Cell Abs (/uL)  Date Value  12/18/2016 750  09/23/2016 670  06/30/2016 710   Currently he denies anal lesions, he denies pain with defecation.  He is seen with interpreter. He reads english for the consent, which is explained via the interpreter.    PRE-OPERATIVE DIAGNOSIS:  Anal dysplasia  POST-OPERATIVE DIAGNOSIS:  Anal dysplasia  PROCEDURE:    SURGEON:  Louay Myrie  ASSISTANT: Poole  ANESTHESIA:   local  EBL: < 10 cc   SPECIMEN:  Scraping  DISPOSITION OF SPECIMEN:  PATHOLOGY  COUNTS:  YES  PLAN OF CARE: home  PATIENT DISPOSITION:  home  INDICATION: anal dysplasia  OR FINDINGS: mild superficial acetowhite uptake. Roughly 3 o'clock.   DESCRIPTION: The patient was identified in the waiting area and taken to the exam room where they were laid on the table in the lateral decubitus position.  The patient was then prepped and draped in the usual fashion. A surgical timeout was performed indicating the correct patient, procedure, positioning. An anal pap was performed.   After this was completed, a sponge was soaked in 2% acetic acid was placed over the perianal region. This was allowed to soak for 1 minute. The sponge was removed and the perianal region was evaluated with a colposcope.  There were no external lesions.  The internal anal canal was evaluated via anoscopy with an anoscope.  There was one area with irration without focal lesion for biopsy. There was acetowhite uptake in this area. After this was completed, hemostasis was achieved with gauze.    PLAN:  Await result of PAP I offered to have the pt return in 1 year for a repeat exam or to have an eval by surgery. He wishes to return to ID clinic for repeat HRA.  Will schedule pt for  f/u colonoscopy due to his prev polyps. It is explained to him that today's exam is not sufficient to eval for polyps.   **Disclaimer: This note may have been dictated with voice recognition software. Similar sounding words can inadvertently be transcribed and this note may contain transcription errors which may not have been corrected upon publication of note.**

## 2017-08-13 ENCOUNTER — Encounter: Payer: Self-pay | Admitting: Infectious Diseases

## 2017-09-08 ENCOUNTER — Other Ambulatory Visit: Payer: Self-pay | Admitting: Infectious Disease

## 2017-09-08 DIAGNOSIS — G4700 Insomnia, unspecified: Secondary | ICD-10-CM

## 2017-10-12 ENCOUNTER — Other Ambulatory Visit: Payer: Self-pay | Admitting: Infectious Disease

## 2017-10-12 DIAGNOSIS — B20 Human immunodeficiency virus [HIV] disease: Secondary | ICD-10-CM

## 2017-11-03 ENCOUNTER — Ambulatory Visit: Payer: 59 | Admitting: Family Medicine

## 2017-11-03 ENCOUNTER — Encounter: Payer: Self-pay | Admitting: Family Medicine

## 2017-11-03 VITALS — BP 128/86 | HR 74 | Temp 97.5°F | Ht 68.5 in | Wt 205.2 lb

## 2017-11-03 DIAGNOSIS — R5383 Other fatigue: Secondary | ICD-10-CM | POA: Diagnosis not present

## 2017-11-03 DIAGNOSIS — R42 Dizziness and giddiness: Secondary | ICD-10-CM

## 2017-11-03 DIAGNOSIS — B20 Human immunodeficiency virus [HIV] disease: Secondary | ICD-10-CM

## 2017-11-03 NOTE — Progress Notes (Signed)
   Subjective:    Patient ID: Edward Gillespie, male    DOB: 08-Aug-1965, 52 y.o.   MRN: [id number]  HPI He is here to establish his care.  He does have underlying HIV and will continue to be followed in the ID clinic.  He apparently has an appointment set up for July.  He complains of a 63-month history of intermittent dizziness as well as fatigue and occasional headache.  There is a slight language barrier as he does speak Spanish as his native tongue.  No sore throat, cough, congestion, earache, blurred or double vision, nausea, vomiting or diarrhea skin or hair changes, chest pain, shortness of breath,.  He is concerned about his blood pressure.  Apparently his mother does have hypertension.   Review of Systems     Objective:   Physical Exam Alert and in no distress. Tympanic membranes and canals are normal. Pharyngeal area is normal. Neck is supple without adenopathy or thyromegaly. Cardiac exam shows a regular sinus rhythm without murmurs or gallops. Lungs are clear to auscultation.        Assessment & Plan:  HIV disease (HCC)  Dizziness Difficult to assess what is really causing his dizziness.  I will do basic blood work on him

## 2017-11-04 LAB — COMPREHENSIVE METABOLIC PANEL
ALBUMIN: 4.8 g/dL (ref 3.5–5.5)
ALT: 22 IU/L (ref 0–44)
AST: 19 IU/L (ref 0–40)
Albumin/Globulin Ratio: 2 (ref 1.2–2.2)
Alkaline Phosphatase: 90 IU/L (ref 39–117)
BILIRUBIN TOTAL: 0.3 mg/dL (ref 0.0–1.2)
BUN/Creatinine Ratio: 22 — ABNORMAL HIGH (ref 9–20)
BUN: 22 mg/dL (ref 6–24)
CO2: 18 mmol/L — ABNORMAL LOW (ref 20–29)
Calcium: 9.6 mg/dL (ref 8.7–10.2)
Chloride: 107 mmol/L — ABNORMAL HIGH (ref 96–106)
Creatinine, Ser: 1.02 mg/dL (ref 0.76–1.27)
GFR calc Af Amer: 98 mL/min/{1.73_m2} (ref >59)
GFR, EST NON AFRICAN AMERICAN: 85 mL/min/{1.73_m2} (ref >59)
GLOBULIN, TOTAL: 2.4 g/dL (ref 1.5–4.5)
GLUCOSE: 97 mg/dL (ref 65–99)
POTASSIUM: 4.4 mmol/L (ref 3.5–5.2)
Sodium: 142 mmol/L (ref 134–144)
TOTAL PROTEIN: 7.2 g/dL (ref 6.0–8.5)

## 2017-11-04 LAB — CBC WITH DIFFERENTIAL/PLATELET
Basophils Absolute: 0 10*3/uL (ref 0.0–0.2)
Basos: 1 %
EOS (ABSOLUTE): 0.3 10*3/uL (ref 0.0–0.4)
Eos: 5 %
Hematocrit: 46.7 % (ref 37.5–51.0)
Hemoglobin: 16.3 g/dL (ref 13.0–17.7)
Immature Grans (Abs): 0 10*3/uL (ref 0.0–0.1)
Immature Granulocytes: 0 %
LYMPHS ABS: 1.9 10*3/uL (ref 0.7–3.1)
Lymphs: 32 %
MCH: 31.2 pg (ref 26.6–33.0)
MCHC: 34.9 g/dL (ref 31.5–35.7)
MCV: 89 fL (ref 79–97)
MONOS ABS: 0.5 10*3/uL (ref 0.1–0.9)
Monocytes: 9 %
NEUTROS ABS: 3.1 10*3/uL (ref 1.4–7.0)
Neutrophils: 53 %
PLATELETS: 274 10*3/uL (ref 150–450)
RBC: 5.23 x10E6/uL (ref 4.14–5.80)
RDW: 13.6 % (ref 12.3–15.4)
WBC: 5.9 10*3/uL (ref 3.4–10.8)

## 2017-11-04 LAB — TESTOSTERONE: Testosterone: 172 ng/dL — ABNORMAL LOW (ref 264–916)

## 2017-11-04 LAB — LIPID PANEL
CHOL/HDL RATIO: 5.9 ratio — AB (ref 0.0–5.0)
CHOLESTEROL TOTAL: 177 mg/dL (ref 100–199)
HDL: 30 mg/dL — AB (ref >39)
TRIGLYCERIDES: 654 mg/dL — AB (ref 0–149)

## 2017-11-04 LAB — TSH: TSH: 0.811 u[IU]/mL (ref 0.450–4.500)

## 2017-11-09 ENCOUNTER — Other Ambulatory Visit: Payer: Self-pay | Admitting: Infectious Disease

## 2017-11-09 DIAGNOSIS — B20 Human immunodeficiency virus [HIV] disease: Secondary | ICD-10-CM

## 2017-12-15 ENCOUNTER — Other Ambulatory Visit: Payer: Self-pay

## 2017-12-22 ENCOUNTER — Other Ambulatory Visit: Payer: Self-pay

## 2017-12-22 DIAGNOSIS — Z789 Other specified health status: Secondary | ICD-10-CM

## 2017-12-22 DIAGNOSIS — B20 Human immunodeficiency virus [HIV] disease: Secondary | ICD-10-CM

## 2017-12-22 DIAGNOSIS — Z79899 Other long term (current) drug therapy: Secondary | ICD-10-CM

## 2017-12-22 DIAGNOSIS — Z113 Encounter for screening for infections with a predominantly sexual mode of transmission: Secondary | ICD-10-CM

## 2017-12-23 LAB — RPR: RPR: NONREACTIVE

## 2017-12-23 LAB — COMPLETE METABOLIC PANEL WITH GFR
AG Ratio: 2 (calc) (ref 1.0–2.5)
ALKALINE PHOSPHATASE (APISO): 67 U/L (ref 40–115)
ALT: 29 U/L (ref 9–46)
AST: 21 U/L (ref 10–35)
Albumin: 4.5 g/dL (ref 3.6–5.1)
BILIRUBIN TOTAL: 0.5 mg/dL (ref 0.2–1.2)
BUN: 16 mg/dL (ref 7–25)
CHLORIDE: 108 mmol/L (ref 98–110)
CO2: 26 mmol/L (ref 20–32)
Calcium: 9.5 mg/dL (ref 8.6–10.3)
Creat: 1.08 mg/dL (ref 0.70–1.33)
GFR, Est African American: 92 mL/min/{1.73_m2} (ref >=60)
GFR, Est Non African American: 79 mL/min/{1.73_m2} (ref >=60)
GLUCOSE: 104 mg/dL — AB (ref 65–99)
Globulin: 2.2 g/dL (calc) (ref 1.9–3.7)
POTASSIUM: 3.6 mmol/L (ref 3.5–5.3)
Sodium: 142 mmol/L (ref 135–146)
Total Protein: 6.7 g/dL (ref 6.1–8.1)

## 2017-12-23 LAB — LIPID PANEL
CHOL/HDL RATIO: 4.9 (calc) (ref <5.0)
Cholesterol: 156 mg/dL (ref <200)
HDL: 32 mg/dL — ABNORMAL LOW (ref >40)
LDL CHOLESTEROL (CALC): 89 mg/dL
NON-HDL CHOLESTEROL (CALC): 124 mg/dL (ref <130)
Triglycerides: 268 mg/dL — ABNORMAL HIGH (ref <150)

## 2017-12-23 LAB — CBC WITH DIFFERENTIAL/PLATELET
Basophils Absolute: 33 cells/uL (ref 0–200)
Basophils Relative: 0.4 %
EOS PCT: 2.7 %
Eosinophils Absolute: 224 cells/uL (ref 15–500)
HEMATOCRIT: 46.2 % (ref 38.5–50.0)
HEMOGLOBIN: 16 g/dL (ref 13.2–17.1)
LYMPHS ABS: 1718 {cells}/uL (ref 850–3900)
MCH: 30.5 pg (ref 27.0–33.0)
MCHC: 34.6 g/dL (ref 32.0–36.0)
MCV: 88.2 fL (ref 80.0–100.0)
MONOS PCT: 9.6 %
MPV: 9.9 fL (ref 7.5–12.5)
NEUTROS ABS: 5528 {cells}/uL (ref 1500–7800)
Neutrophils Relative %: 66.6 %
Platelets: 251 10*3/uL (ref 140–400)
RBC: 5.24 10*6/uL (ref 4.20–5.80)
RDW: 12.8 % (ref 11.0–15.0)
Total Lymphocyte: 20.7 %
WBC mixed population: 797 cells/uL (ref 200–950)
WBC: 8.3 10*3/uL (ref 3.8–10.8)

## 2017-12-23 LAB — T-HELPER CELL (CD4) - (RCID CLINIC ONLY)
CD4 T CELL ABS: 590 /uL (ref 400–2700)
CD4 T CELL HELPER: 32 % — AB (ref 33–55)

## 2017-12-24 LAB — HIV-1 RNA QUANT-NO REFLEX-BLD
HIV 1 RNA QUANT: NOT DETECTED {copies}/mL
HIV-1 RNA QUANT, LOG: NOT DETECTED {Log_copies}/mL

## 2017-12-29 ENCOUNTER — Ambulatory Visit (INDEPENDENT_AMBULATORY_CARE_PROVIDER_SITE_OTHER): Payer: 59 | Admitting: Infectious Disease

## 2017-12-29 ENCOUNTER — Encounter: Payer: Self-pay | Admitting: Infectious Disease

## 2017-12-29 VITALS — BP 124/81 | HR 78 | Temp 98.3°F | Wt 209.0 lb

## 2017-12-29 DIAGNOSIS — R42 Dizziness and giddiness: Secondary | ICD-10-CM | POA: Diagnosis not present

## 2017-12-29 DIAGNOSIS — K6282 Dysplasia of anus: Secondary | ICD-10-CM

## 2017-12-29 DIAGNOSIS — B20 Human immunodeficiency virus [HIV] disease: Secondary | ICD-10-CM

## 2017-12-29 DIAGNOSIS — Z113 Encounter for screening for infections with a predominantly sexual mode of transmission: Secondary | ICD-10-CM | POA: Diagnosis not present

## 2017-12-29 HISTORY — DX: Dizziness and giddiness: R42

## 2017-12-29 MED ORDER — BICTEGRAVIR-EMTRICITAB-TENOFOV 50-200-25 MG PO TABS: 1.0000 | ORAL_TABLET | Freq: Every day | ORAL | 11 refills | Status: DC
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
## 2018-01-01 ENCOUNTER — Encounter: Payer: Self-pay | Admitting: Pharmacist Clinician (PhC)/ Clinical Pharmacy Specialist

## 2018-01-01 ENCOUNTER — Other Ambulatory Visit: Payer: Self-pay | Admitting: Pharmacist

## 2018-01-01 DIAGNOSIS — G4700 Insomnia, unspecified: Secondary | ICD-10-CM

## 2018-01-01 MED ORDER — TRAZODONE HCL 50 MG PO TABS: 50.0000 mg | ORAL_TABLET | Freq: Every day | ORAL | 4 refills | Status: DC

## 2018-02-12 ENCOUNTER — Telehealth: Payer: Self-pay | Admitting: Behavioral Health

## 2018-02-12 NOTE — Telephone Encounter (Signed)
Patient is prescribed Biktarvy.  [facility] faxed in paperwork requesting lower cost option or a Prior authorization. Angeline SlimAshley Hill RN

## 2018-02-12 NOTE — Telephone Encounter (Signed)
I would prefer to go w Biktarvy w prior auth. What "lower cost options were they proposing?

## 2018-03-01 DIAGNOSIS — H40003 Preglaucoma, unspecified, bilateral: Secondary | ICD-10-CM | POA: Diagnosis not present

## 2018-06-03 ENCOUNTER — Other Ambulatory Visit: Payer: Self-pay | Admitting: Pharmacist

## 2018-06-03 DIAGNOSIS — G4700 Insomnia, unspecified: Secondary | ICD-10-CM

## 2018-06-03 MED ORDER — TRAZODONE HCL 50 MG PO TABS: 50.0000 mg | ORAL_TABLET | Freq: Every day | ORAL | 4 refills | Status: DC

## 2018-06-21 ENCOUNTER — Other Ambulatory Visit: Payer: 59

## 2018-06-21 ENCOUNTER — Other Ambulatory Visit (HOSPITAL_COMMUNITY)
Admission: RE | Admit: 2018-06-21 | Discharge: 2018-06-21 | Disposition: A | Discharge status: Home / Self Care | Payer: 59 | Source: Ambulatory Visit | Attending: Infectious Disease | Admitting: Infectious Disease

## 2018-06-21 DIAGNOSIS — B20 Human immunodeficiency virus [HIV] disease: Secondary | ICD-10-CM | POA: Insufficient documentation

## 2018-06-22 LAB — T-HELPER CELL (CD4) - (RCID CLINIC ONLY)
CD4 % Helper T Cell: 32 % — ABNORMAL LOW (ref 33–55)
CD4 T Cell Abs: 550 /uL (ref 400–2700)

## 2018-06-22 LAB — MICROALBUMIN / CREATININE URINE RATIO
Creatinine, Urine: 140 mg/dL (ref 20–320)
Microalb Creat Ratio: 4 mcg/mg creat (ref <30)
Microalb, Ur: 0.5 mg/dL

## 2018-06-22 LAB — URINE CYTOLOGY ANCILLARY ONLY
Chlamydia: NEGATIVE
Neisseria Gonorrhea: NEGATIVE

## 2018-06-23 LAB — COMPLETE METABOLIC PANEL WITH GFR
AG Ratio: 1.8 (calc) (ref 1.0–2.5)
ALT: 49 U/L — ABNORMAL HIGH (ref 9–46)
AST: 33 U/L (ref 10–35)
Albumin: 4.6 g/dL (ref 3.6–5.1)
Alkaline phosphatase (APISO): 63 U/L (ref 40–115)
BUN: 15 mg/dL (ref 7–25)
CO2: 27 mmol/L (ref 20–32)
CREATININE: 1.01 mg/dL (ref 0.70–1.33)
Calcium: 9.7 mg/dL (ref 8.6–10.3)
Chloride: 107 mmol/L (ref 98–110)
GFR, Est African American: 99 mL/min/{1.73_m2} (ref >=60)
GFR, Est Non African American: 85 mL/min/{1.73_m2} (ref >=60)
Globulin: 2.6 g/dL (calc) (ref 1.9–3.7)
Glucose, Bld: 88 mg/dL (ref 65–99)
Potassium: 4.5 mmol/L (ref 3.5–5.3)
Sodium: 142 mmol/L (ref 135–146)
Total Bilirubin: 0.6 mg/dL (ref 0.2–1.2)
Total Protein: 7.2 g/dL (ref 6.1–8.1)

## 2018-06-23 LAB — RPR: RPR Ser Ql: NONREACTIVE

## 2018-06-23 LAB — CBC WITH DIFFERENTIAL/PLATELET
ABSOLUTE MONOCYTES: 419 {cells}/uL (ref 200–950)
Basophils Absolute: 42 cells/uL (ref 0–200)
Basophils Relative: 0.8 %
Eosinophils Absolute: 207 cells/uL (ref 15–500)
Eosinophils Relative: 3.9 %
HCT: 50 % (ref 38.5–50.0)
Hemoglobin: 17.7 g/dL — ABNORMAL HIGH (ref 13.2–17.1)
LYMPHS ABS: 1760 {cells}/uL (ref 850–3900)
MCH: 31.2 pg (ref 27.0–33.0)
MCHC: 35.4 g/dL (ref 32.0–36.0)
MCV: 88.2 fL (ref 80.0–100.0)
MPV: 9.5 fL (ref 7.5–12.5)
Monocytes Relative: 7.9 %
NEUTROS ABS: 2873 {cells}/uL (ref 1500–7800)
Neutrophils Relative %: 54.2 %
Platelets: 283 10*3/uL (ref 140–400)
RBC: 5.67 10*6/uL (ref 4.20–5.80)
RDW: 12.5 % (ref 11.0–15.0)
Total Lymphocyte: 33.2 %
WBC: 5.3 10*3/uL (ref 3.8–10.8)

## 2018-06-23 LAB — HIV-1 RNA QUANT-NO REFLEX-BLD
HIV 1 RNA Quant: 24 copies/mL — ABNORMAL HIGH
HIV-1 RNA Quant, Log: 1.38 Log copies/mL — ABNORMAL HIGH

## 2018-06-23 LAB — LIPID PANEL
Cholesterol: 190 mg/dL (ref <200)
HDL: 35 mg/dL — AB (ref >40)
LDL Cholesterol (Calc): 117 mg/dL (calc) — ABNORMAL HIGH
Non-HDL Cholesterol (Calc): 155 mg/dL (calc) — ABNORMAL HIGH (ref <130)
Total CHOL/HDL Ratio: 5.4 (calc) — ABNORMAL HIGH (ref <5.0)
Triglycerides: 265 mg/dL — ABNORMAL HIGH (ref <150)

## 2018-07-06 ENCOUNTER — Encounter: Payer: Self-pay | Admitting: Infectious Disease

## 2018-07-06 ENCOUNTER — Ambulatory Visit (INDEPENDENT_AMBULATORY_CARE_PROVIDER_SITE_OTHER): Payer: 59 | Admitting: Infectious Disease

## 2018-07-06 VITALS — BP 138/91 | HR 77 | Temp 98.3°F | Wt 220.0 lb

## 2018-07-06 DIAGNOSIS — Z79899 Other long term (current) drug therapy: Secondary | ICD-10-CM | POA: Diagnosis not present

## 2018-07-06 DIAGNOSIS — Z23 Encounter for immunization: Secondary | ICD-10-CM | POA: Diagnosis not present

## 2018-07-06 DIAGNOSIS — N529 Male erectile dysfunction, unspecified: Secondary | ICD-10-CM | POA: Diagnosis not present

## 2018-07-06 DIAGNOSIS — B20 Human immunodeficiency virus [HIV] disease: Secondary | ICD-10-CM | POA: Diagnosis not present

## 2018-07-06 DIAGNOSIS — N486 Induration penis plastica: Secondary | ICD-10-CM

## 2018-07-06 DIAGNOSIS — K6282 Dysplasia of anus: Secondary | ICD-10-CM

## 2018-07-06 HISTORY — DX: Male erectile dysfunction, unspecified: N52.9

## 2018-07-06 MED ORDER — SILDENAFIL CITRATE 50 MG PO TABS: ORAL_TABLET | ORAL | 4 refills | Status: DC
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
## 2018-07-08 ENCOUNTER — Other Ambulatory Visit: Payer: Self-pay | Admitting: *Deleted

## 2018-07-08 DIAGNOSIS — N529 Male erectile dysfunction, unspecified: Secondary | ICD-10-CM

## 2018-07-08 MED ORDER — SILDENAFIL CITRATE 50 MG PO TABS: ORAL_TABLET | ORAL | 4 refills | Status: DC

## 2018-10-20 ENCOUNTER — Other Ambulatory Visit: Payer: Self-pay | Admitting: Pharmacist

## 2018-10-20 DIAGNOSIS — G4700 Insomnia, unspecified: Secondary | ICD-10-CM

## 2018-12-13 ENCOUNTER — Other Ambulatory Visit: Payer: Self-pay | Admitting: Infectious Disease

## 2018-12-13 DIAGNOSIS — B20 Human immunodeficiency virus [HIV] disease: Secondary | ICD-10-CM

## 2018-12-22 ENCOUNTER — Telehealth: Payer: Self-pay | Admitting: Pharmacy Technician

## 2018-12-22 NOTE — Telephone Encounter (Signed)
[facility] Patient Advocate Encounter   Patient has been approved for [employer] Advancing Access Patient Assistance Program for [place]  for 30-days. This assistance will make the patient's copay $0.  I have spoken with the patient and they will have medication shipped from [facility] today to arrive tomorrow. The insurance at his new job will start in 71-days. He will update [place] when the insurance becomes active. Will submit full application to cover insurance gap.  Member ID: [id number] [place]: [id number] PCN: [id number] Group: [id number]  Patient knows to call the office with questions or concerns.  Bartholomew Crews, CPhT Specialty Pharmacy Patient [facility] Phone: [phone] Fax: [phone] 12/22/2018 9:21 AM

## 2018-12-24 NOTE — Telephone Encounter (Signed)
[facility] Patient Advocate Encounter   Patient has been approved for [employer] Advancing Access Patient Assistance Program for [place]  from 12/22/2018 to 12/22/2019. This assistance will make the patient's copay $0.  I tried to call the patient but had to leave a voicemail. Patient has a new job working third shift and asked [place] to call spouse at [phone].  The billing information is same as below  Patient knows to call the office with questions or concerns.

## 2019-03-17 ENCOUNTER — Other Ambulatory Visit: Payer: Self-pay | Admitting: Infectious Disease

## 2019-03-17 DIAGNOSIS — G4700 Insomnia, unspecified: Secondary | ICD-10-CM

## 2019-07-18 ENCOUNTER — Other Ambulatory Visit: Payer: Self-pay

## 2019-07-18 ENCOUNTER — Other Ambulatory Visit: Payer: 59

## 2019-07-18 ENCOUNTER — Other Ambulatory Visit (HOSPITAL_COMMUNITY)
Admission: RE | Admit: 2019-07-18 | Discharge: 2019-07-18 | Disposition: A | Discharge status: Home / Self Care | Payer: 59 | Source: Ambulatory Visit | Attending: Infectious Disease | Admitting: Infectious Disease

## 2019-07-18 DIAGNOSIS — Z79899 Other long term (current) drug therapy: Secondary | ICD-10-CM

## 2019-07-18 DIAGNOSIS — Z113 Encounter for screening for infections with a predominantly sexual mode of transmission: Secondary | ICD-10-CM | POA: Diagnosis present

## 2019-07-18 DIAGNOSIS — Z789 Other specified health status: Secondary | ICD-10-CM

## 2019-07-18 DIAGNOSIS — N486 Induration penis plastica: Secondary | ICD-10-CM

## 2019-07-18 DIAGNOSIS — B20 Human immunodeficiency virus [HIV] disease: Secondary | ICD-10-CM | POA: Diagnosis present

## 2019-07-18 DIAGNOSIS — Z21 Asymptomatic human immunodeficiency virus [HIV] infection status: Secondary | ICD-10-CM

## 2019-07-18 DIAGNOSIS — N529 Male erectile dysfunction, unspecified: Secondary | ICD-10-CM

## 2019-07-18 NOTE — Addendum Note (Signed)
Addended byDoristine Devoid on: 07/18/2019 08:52 AM   Modules accepted: Orders

## 2019-07-18 NOTE — Addendum Note (Signed)
Addended by: ABBITT, KATRINA F on: 07/18/2019 08:52 AM   Modules accepted: Orders  

## 2019-07-19 LAB — URINE CYTOLOGY ANCILLARY ONLY
Chlamydia: NEGATIVE
Comment: NEGATIVE
Comment: NORMAL
Neisseria Gonorrhea: NEGATIVE

## 2019-07-19 LAB — T-HELPER CELL (CD4) - (RCID CLINIC ONLY)
CD4 % Helper T Cell: 35 % (ref 33–65)
CD4 T Cell Abs: 653 /uL (ref 400–1790)

## 2019-07-21 LAB — CBC WITH DIFFERENTIAL/PLATELET
Absolute Monocytes: 705 cells/uL (ref 200–950)
Basophils Absolute: 53 cells/uL (ref 0–200)
Basophils Relative: 0.7 %
Eosinophils Absolute: 2580 cells/uL — ABNORMAL HIGH (ref 15–500)
Eosinophils Relative: 34.4 %
HCT: 50.4 % — ABNORMAL HIGH (ref 38.5–50.0)
Hemoglobin: 16.6 g/dL (ref 13.2–17.1)
Lymphs Abs: 1965 cells/uL (ref 850–3900)
MCH: 30.4 pg (ref 27.0–33.0)
MCHC: 32.9 g/dL (ref 32.0–36.0)
MCV: 92.3 fL (ref 80.0–100.0)
MPV: 9.6 fL (ref 7.5–12.5)
Monocytes Relative: 9.4 %
Neutro Abs: 2198 cells/uL (ref 1500–7800)
Neutrophils Relative %: 29.3 %
Platelets: 252 10*3/uL (ref 140–400)
RBC: 5.46 10*6/uL (ref 4.20–5.80)
RDW: 12.6 % (ref 11.0–15.0)
Total Lymphocyte: 26.2 %
WBC: 7.5 10*3/uL (ref 3.8–10.8)

## 2019-07-21 LAB — COMPLETE METABOLIC PANEL WITH GFR
AG Ratio: 2.1 (calc) (ref 1.0–2.5)
ALT: 22 U/L (ref 9–46)
AST: 15 U/L (ref 10–35)
Albumin: 4.6 g/dL (ref 3.6–5.1)
Alkaline phosphatase (APISO): 71 U/L (ref 35–144)
BUN: 19 mg/dL (ref 7–25)
CO2: 23 mmol/L (ref 20–32)
Calcium: 9.8 mg/dL (ref 8.6–10.3)
Chloride: 107 mmol/L (ref 98–110)
Creat: 1.06 mg/dL (ref 0.70–1.33)
GFR, Est African American: 92 mL/min/{1.73_m2} (ref >=60)
GFR, Est Non African American: 80 mL/min/{1.73_m2} (ref >=60)
Globulin: 2.2 g/dL (calc) (ref 1.9–3.7)
Glucose, Bld: 72 mg/dL (ref 65–99)
Potassium: 4.4 mmol/L (ref 3.5–5.3)
Sodium: 139 mmol/L (ref 135–146)
Total Bilirubin: 0.4 mg/dL (ref 0.2–1.2)
Total Protein: 6.8 g/dL (ref 6.1–8.1)

## 2019-07-21 LAB — HIV-1 RNA QUANT-NO REFLEX-BLD
HIV 1 RNA Quant: <20 copies/mL
HIV-1 RNA Quant, Log: <1.3 Log copies/mL

## 2019-07-21 LAB — LIPID PANEL
Cholesterol: 160 mg/dL (ref <200)
HDL: 38 mg/dL — ABNORMAL LOW (ref >=40)
LDL Cholesterol (Calc): 93 mg/dL (calc)
Non-HDL Cholesterol (Calc): 122 mg/dL (calc) (ref <130)
Total CHOL/HDL Ratio: 4.2 (calc) (ref <5.0)
Triglycerides: 191 mg/dL — ABNORMAL HIGH (ref <150)

## 2019-07-21 LAB — RPR: RPR Ser Ql: NONREACTIVE

## 2019-07-28 ENCOUNTER — Other Ambulatory Visit: Payer: Self-pay | Admitting: Infectious Disease

## 2019-07-28 DIAGNOSIS — G4700 Insomnia, unspecified: Secondary | ICD-10-CM

## 2019-08-01 ENCOUNTER — Encounter: Payer: 59 | Admitting: Infectious Disease

## 2019-08-09 ENCOUNTER — Encounter: Payer: Self-pay | Admitting: Infectious Disease

## 2019-08-09 ENCOUNTER — Ambulatory Visit: Payer: 59 | Admitting: Infectious Disease

## 2019-08-09 ENCOUNTER — Other Ambulatory Visit: Payer: Self-pay

## 2019-08-09 VITALS — BP 148/106 | HR 66 | Temp 97.9°F | Wt 192.0 lb

## 2019-08-09 DIAGNOSIS — R85610 Atypical squamous cells of undetermined significance on cytologic smear of anus (ASC-US): Secondary | ICD-10-CM | POA: Diagnosis not present

## 2019-08-09 DIAGNOSIS — Z113 Encounter for screening for infections with a predominantly sexual mode of transmission: Secondary | ICD-10-CM | POA: Diagnosis not present

## 2019-08-09 DIAGNOSIS — B20 Human immunodeficiency virus [HIV] disease: Secondary | ICD-10-CM | POA: Diagnosis not present

## 2019-08-09 DIAGNOSIS — Z79899 Other long term (current) drug therapy: Secondary | ICD-10-CM

## 2019-08-09 DIAGNOSIS — I10 Essential (primary) hypertension: Secondary | ICD-10-CM

## 2019-08-09 MED ORDER — BIKTARVY 50-200-25 MG PO TABS: 1.0000 | ORAL_TABLET | Freq: Every day | ORAL | 11 refills | Status: DC

## 2019-08-09 MED ORDER — HYDROCHLOROTHIAZIDE 25 MG PO TABS: 25.0000 mg | ORAL_TABLET | Freq: Every day | ORAL | 11 refills | Status: DC

## 2019-08-09 NOTE — Patient Instructions (Signed)
Make Nursing visit to recheck your blood pressure in 2-3 weeks after being on the hydrochlorothiazide
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
## 2019-11-30 ENCOUNTER — Other Ambulatory Visit: Payer: Self-pay | Admitting: Infectious Disease

## 2019-11-30 DIAGNOSIS — B20 Human immunodeficiency virus [HIV] disease: Secondary | ICD-10-CM

## 2019-12-19 ENCOUNTER — Other Ambulatory Visit: Payer: Self-pay

## 2019-12-19 ENCOUNTER — Encounter (HOSPITAL_COMMUNITY): Payer: Self-pay

## 2019-12-19 ENCOUNTER — Ambulatory Visit (HOSPITAL_COMMUNITY)
Admission: EM | Admit: 2019-12-19 | Discharge: 2019-12-19 | Disposition: A | Discharge status: Home / Self Care | Payer: 59 | Attending: Family Medicine | Admitting: Family Medicine

## 2019-12-19 DIAGNOSIS — M5441 Lumbago with sciatica, right side: Secondary | ICD-10-CM

## 2019-12-19 DIAGNOSIS — M5442 Lumbago with sciatica, left side: Secondary | ICD-10-CM | POA: Diagnosis not present

## 2019-12-19 MED ORDER — CYCLOBENZAPRINE HCL 10 MG PO TABS: 10.0000 mg | ORAL_TABLET | Freq: Two times a day (BID) | ORAL | 0 refills | Status: DC | PRN

## 2019-12-19 MED ORDER — TRAMADOL HCL 50 MG PO TABS: 50.0000 mg | ORAL_TABLET | Freq: Four times a day (QID) | ORAL | 0 refills | Status: DC | PRN

## 2019-12-19 MED ORDER — PREDNISONE 10 MG (21) PO TBPK: ORAL_TABLET | Freq: Every day | ORAL | 0 refills | Status: AC
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
## 2019-12-19 NOTE — ED Triage Notes (Signed)
Pt presents with back pain for the past 4 hrs, after he was picking up a heavy box at work and felt he pulled a muscle. States he has 2 lumbar hernias.  Took trazodone 2 hrs ago.

## 2019-12-19 NOTE — Discharge Instructions (Addendum)
Take the prescribed ibuprofen as needed for your pain.  Take the muscle relaxer Flexeril as needed for muscle spasm; do not drive, operate machinery, or drink alcohol with this medication as it may make you drowsy.    I have sent in a prednisone taper for you to take for 6 days. 6 tablets on day one, 5 tablets on day two, 4 tablets on day three, 3 tablets on day four, 2 tablets on day five, and 1 tablet on day six.   Follow up with an orthopedist if your pain is not improving.  Go to the emergency department if you have worsening pain or develop new symptoms such as difficulty with urination, weakness, numbness, loss of control of your bladder or bowels, fever, chills or other concerns.

## 2019-12-28 ENCOUNTER — Other Ambulatory Visit: Payer: Self-pay | Admitting: Infectious Disease

## 2019-12-28 DIAGNOSIS — G4700 Insomnia, unspecified: Secondary | ICD-10-CM

## 2020-01-17 ENCOUNTER — Other Ambulatory Visit: Payer: Self-pay

## 2020-01-17 ENCOUNTER — Emergency Department (HOSPITAL_COMMUNITY): Payer: No Typology Code available for payment source

## 2020-01-17 ENCOUNTER — Emergency Department (HOSPITAL_COMMUNITY)
Admission: EM | Admit: 2020-01-17 | Discharge: 2020-01-17 | Disposition: A | Discharge status: Home / Self Care | Payer: No Typology Code available for payment source | Attending: Emergency Medicine | Admitting: Emergency Medicine

## 2020-01-17 DIAGNOSIS — R55 Syncope and collapse: Secondary | ICD-10-CM

## 2020-01-17 DIAGNOSIS — Z79899 Other long term (current) drug therapy: Secondary | ICD-10-CM | POA: Diagnosis not present

## 2020-01-17 DIAGNOSIS — E876 Hypokalemia: Secondary | ICD-10-CM | POA: Diagnosis not present

## 2020-01-17 DIAGNOSIS — I10 Essential (primary) hypertension: Secondary | ICD-10-CM

## 2020-01-17 LAB — CBC WITH DIFFERENTIAL/PLATELET
Abs Immature Granulocytes: 0.03 10*3/uL (ref 0.00–0.07)
Basophils Absolute: 0 10*3/uL (ref 0.0–0.1)
Basophils Relative: 1 %
Eosinophils Absolute: 0.2 10*3/uL (ref 0.0–0.5)
Eosinophils Relative: 6 %
HCT: 46.8 % (ref 39.0–52.0)
Hemoglobin: 15.6 g/dL (ref 13.0–17.0)
Immature Granulocytes: 1 %
Lymphocytes Relative: 31 %
Lymphs Abs: 1.2 10*3/uL (ref 0.7–4.0)
MCH: 30.6 pg (ref 26.0–34.0)
MCHC: 33.3 g/dL (ref 30.0–36.0)
MCV: 91.9 fL (ref 80.0–100.0)
Monocytes Absolute: 0.4 10*3/uL (ref 0.1–1.0)
Monocytes Relative: 11 %
Neutro Abs: 2.1 10*3/uL (ref 1.7–7.7)
Neutrophils Relative %: 50 %
Platelets: 214 10*3/uL (ref 150–400)
RBC: 5.09 MIL/uL (ref 4.22–5.81)
RDW: 12.4 % (ref 11.5–15.5)
WBC: 4 10*3/uL (ref 4.0–10.5)
nRBC: 0 % (ref 0.0–0.2)

## 2020-01-17 LAB — TROPONIN I (HIGH SENSITIVITY)
Troponin I (High Sensitivity): <2 ng/L (ref <18)
Troponin I (High Sensitivity): <2 ng/L (ref <18)

## 2020-01-17 LAB — BASIC METABOLIC PANEL
Anion gap: 8 (ref 5–15)
BUN: 17 mg/dL (ref 6–20)
CO2: 26 mmol/L (ref 22–32)
Calcium: 8.8 mg/dL — ABNORMAL LOW (ref 8.9–10.3)
Chloride: 104 mmol/L (ref 98–111)
Creatinine, Ser: 1.19 mg/dL (ref 0.61–1.24)
GFR calc Af Amer: >60 mL/min (ref >60)
GFR calc non Af Amer: >60 mL/min (ref >60)
Glucose, Bld: 128 mg/dL — ABNORMAL HIGH (ref 70–99)
Potassium: 2.9 mmol/L — ABNORMAL LOW (ref 3.5–5.1)
Sodium: 138 mmol/L (ref 135–145)

## 2020-01-17 MED ORDER — SODIUM CHLORIDE 0.9 % IV BOLUS
1000.0000 mL | Freq: Once | INTRAVENOUS | Status: AC
  Administered 2020-01-17: 1000 mL via INTRAVENOUS

## 2020-01-17 MED ORDER — POTASSIUM CHLORIDE CRYS ER 20 MEQ PO TBCR
40.0000 meq | EXTENDED_RELEASE_TABLET | Freq: Once | ORAL | Status: AC
  Administered 2020-01-17: 40 meq via ORAL
  Filled 2020-01-17: qty 2
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
## 2020-01-17 NOTE — Discharge Instructions (Addendum)
You were seen in the emergency department for evaluation of a syncopal or fainting event at work.  You had blood work EKG and a CAT scan of your head that did not show any serious findings.  Your symptoms improved after some IV fluids.  Possible that you are a little dehydrated.  Your potassium was also mildly low.  Please contact your primary care doctor for close follow-up.  You were also very worried about your blood pressure being elevated so we are putting a referral to cardiology for you

## 2020-01-17 NOTE — ED Notes (Signed)
Patient Alert and oriented to baseline. Stable and ambulatory to baseline. Patient verbalized understanding of the discharge instructions.  Patient belongings were taken by the patient.   

## 2020-01-17 NOTE — ED Notes (Signed)
Pt ambulatory without difficulty. Gait steady and even.  

## 2020-01-17 NOTE — ED Triage Notes (Signed)
Pt here from work felt dizzy and passed out. Pt hit chin on floor. Pt didn't remember fall or passing out. Pt co dizziness and being tired. Ems gave NS. 18g l ac.  cbg 99 130/80, rr 16 HR 80

## 2020-01-19 ENCOUNTER — Encounter: Payer: Self-pay | Admitting: Family Medicine

## 2020-01-19 ENCOUNTER — Ambulatory Visit: Payer: 59 | Admitting: Family Medicine

## 2020-01-19 ENCOUNTER — Other Ambulatory Visit: Payer: Self-pay

## 2020-01-19 VITALS — BP 132/86 | HR 93 | Temp 96.0°F | Wt 195.0 lb

## 2020-01-19 DIAGNOSIS — R42 Dizziness and giddiness: Secondary | ICD-10-CM

## 2020-01-19 DIAGNOSIS — E876 Hypokalemia: Secondary | ICD-10-CM

## 2020-01-19 NOTE — Progress Notes (Signed)
   Subjective:    Patient ID: Edward Gillespie, male    DOB: April 15, 1966, 54 y.o.   MRN: [id number]  HPI He is here for follow-up visit after recent emergency room visit.  He apparently had a syncopal episode while at work and fell injuring his chin.  His main symptom is dyspnea and difficulty with dizziness and weakness.  He admits to not drinking a lot of fluids.  The dizziness can occur at any time while he is standing or sitting or moving around.  He has not noted any other symptoms associated with him specifically blurred vision, double vision, heart rate changes.   Review of Systems     Objective:   Physical Exam Alert and in no distress. Tympanic membranes and canals are normal. Pharyngeal area is normal. Neck is supple without adenopathy or thyromegaly. Cardiac exam shows a regular sinus rhythm without murmurs or gallops. Lungs are clear to auscultation. The emergency room record, x-rays and blood work was reviewed.  Did have a low potassium as well as calcium.       Assessment & Plan:  Vertigo - Plan: Comprehensive metabolic panel  Hypokalemia  Hypocalcemia I explained that at this time I do not have a good reason for the dizziness.  Instructed him on how to properly check his pulse and to check it when he has a episode of dizziness to see if this is heart related.  Follow-up on the blood work when it returns tomorrow.

## 2020-01-20 LAB — COMPREHENSIVE METABOLIC PANEL
ALT: 37 IU/L (ref 0–44)
AST: 24 IU/L (ref 0–40)
Albumin/Globulin Ratio: 2 (ref 1.2–2.2)
Albumin: 4.9 g/dL (ref 3.8–4.9)
Alkaline Phosphatase: 71 IU/L (ref 48–121)
BUN/Creatinine Ratio: 8 — ABNORMAL LOW (ref 9–20)
BUN: 10 mg/dL (ref 6–24)
Bilirubin Total: 0.3 mg/dL (ref 0.0–1.2)
CO2: 26 mmol/L (ref 20–29)
Calcium: 10 mg/dL (ref 8.7–10.2)
Chloride: 101 mmol/L (ref 96–106)
Creatinine, Ser: 1.21 mg/dL (ref 0.76–1.27)
GFR calc Af Amer: 79 mL/min/{1.73_m2} (ref >59)
GFR calc non Af Amer: 68 mL/min/{1.73_m2} (ref >59)
Globulin, Total: 2.4 g/dL (ref 1.5–4.5)
Glucose: 82 mg/dL (ref 65–99)
Potassium: 4.4 mmol/L (ref 3.5–5.2)
Sodium: 143 mmol/L (ref 134–144)
Total Protein: 7.3 g/dL (ref 6.0–8.5)

## 2020-01-25 ENCOUNTER — Other Ambulatory Visit: Payer: Self-pay

## 2020-01-25 DIAGNOSIS — R42 Dizziness and giddiness: Secondary | ICD-10-CM

## 2020-01-26 ENCOUNTER — Encounter: Payer: Self-pay | Admitting: General Practice

## 2020-01-31 ENCOUNTER — Other Ambulatory Visit: Payer: Self-pay

## 2020-01-31 ENCOUNTER — Ambulatory Visit: Payer: 59 | Admitting: Diagnostic Neuroimaging

## 2020-01-31 ENCOUNTER — Encounter: Payer: Self-pay | Admitting: Diagnostic Neuroimaging

## 2020-01-31 VITALS — BP 128/88 | HR 72 | Ht 67.0 in | Wt 200.0 lb

## 2020-01-31 DIAGNOSIS — R200 Anesthesia of skin: Secondary | ICD-10-CM

## 2020-01-31 DIAGNOSIS — R55 Syncope and collapse: Secondary | ICD-10-CM

## 2020-01-31 DIAGNOSIS — R42 Dizziness and giddiness: Secondary | ICD-10-CM

## 2020-01-31 NOTE — Patient Instructions (Signed)
SYNCOPE (? provoked by dehydration) / INTERMITTENT LIGHTHEADEDNESS / PALPITATIONS  - BP mgmt per PCP  TINGLING FINGERS / HANDS - check B12, A1c  THROBBING SENSATION IN HEAD (associated with high blood pressure) - BP mgmt per PCP
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
## 2020-02-01 ENCOUNTER — Encounter: Payer: Self-pay | Admitting: *Deleted

## 2020-02-01 LAB — HEMOGLOBIN A1C
Est. average glucose Bld gHb Est-mCnc: 103 mg/dL
Hgb A1c MFr Bld: 5.2 % (ref 4.8–5.6)

## 2020-02-01 LAB — VITAMIN B12: Vitamin B-12: 520 pg/mL (ref 232–1245)

## 2020-02-02 ENCOUNTER — Other Ambulatory Visit: Payer: Self-pay | Admitting: Infectious Disease

## 2020-02-02 DIAGNOSIS — G4700 Insomnia, unspecified: Secondary | ICD-10-CM

## 2020-02-02 DIAGNOSIS — B20 Human immunodeficiency virus [HIV] disease: Secondary | ICD-10-CM

## 2020-02-03 ENCOUNTER — Other Ambulatory Visit: Payer: Self-pay

## 2020-02-03 DIAGNOSIS — G4700 Insomnia, unspecified: Secondary | ICD-10-CM

## 2020-02-03 DIAGNOSIS — B20 Human immunodeficiency virus [HIV] disease: Secondary | ICD-10-CM

## 2020-02-03 MED ORDER — BIKTARVY 50-200-25 MG PO TABS: 1.0000 | ORAL_TABLET | Freq: Every day | ORAL | 0 refills | Status: DC

## 2020-02-03 MED ORDER — TRAZODONE HCL 50 MG PO TABS: 50.0000 mg | ORAL_TABLET | Freq: Every day | ORAL | 0 refills | Status: DC

## 2020-02-06 ENCOUNTER — Telehealth: Payer: Self-pay | Admitting: Family Medicine

## 2020-02-06 NOTE — Telephone Encounter (Signed)
pts husband called and said pt is still having the same issues with his hands tingling and numb. He saw a  Neurologist but they said it may be the Hydrochlorothizide because everything else was normal. He wants to see if there is something that he can get prescribed to help with the symptoms

## 2020-02-06 NOTE — Telephone Encounter (Signed)
Explain that the hand tingling and numbness is not related to the diuretic.  The neurologist ordered a B12 and a diabetes test both of which were normal.  His concern about the diuretic was because of the syncopal episode and if he is got himself hydrated that should not be an issue.  I think you got confused over what the neurologist was saying.  Have him set up an appointment for follow-up.

## 2020-02-06 NOTE — Telephone Encounter (Signed)
Pt called back about the same issue

## 2020-02-07 NOTE — Telephone Encounter (Signed)
Pt. Husband called back and got him scheduled for a f/u with you on 02/08/20.

## 2020-02-08 ENCOUNTER — Other Ambulatory Visit: Payer: Self-pay

## 2020-02-08 ENCOUNTER — Other Ambulatory Visit: Payer: 59

## 2020-02-08 ENCOUNTER — Other Ambulatory Visit (HOSPITAL_COMMUNITY)
Admission: RE | Admit: 2020-02-08 | Discharge: 2020-02-08 | Disposition: A | Discharge status: Home / Self Care | Payer: 59 | Source: Ambulatory Visit | Attending: Infectious Disease | Admitting: Infectious Disease

## 2020-02-08 ENCOUNTER — Ambulatory Visit: Payer: 59 | Admitting: Family Medicine

## 2020-02-08 ENCOUNTER — Encounter: Payer: Self-pay | Admitting: Family Medicine

## 2020-02-08 VITALS — BP 116/78 | HR 91 | Temp 97.1°F | Wt 203.2 lb

## 2020-02-08 DIAGNOSIS — B20 Human immunodeficiency virus [HIV] disease: Secondary | ICD-10-CM

## 2020-02-08 DIAGNOSIS — G43809 Other migraine, not intractable, without status migrainosus: Secondary | ICD-10-CM | POA: Diagnosis not present

## 2020-02-08 DIAGNOSIS — Z79899 Other long term (current) drug therapy: Secondary | ICD-10-CM

## 2020-02-08 DIAGNOSIS — Z21 Asymptomatic human immunodeficiency virus [HIV] infection status: Secondary | ICD-10-CM

## 2020-02-08 DIAGNOSIS — Z113 Encounter for screening for infections with a predominantly sexual mode of transmission: Secondary | ICD-10-CM

## 2020-02-08 DIAGNOSIS — R195 Other fecal abnormalities: Secondary | ICD-10-CM

## 2020-02-08 NOTE — Addendum Note (Signed)
Addended by: Clista Bernhardt on: 02/08/2020 03:14 PM   Modules accepted: Orders

## 2020-02-08 NOTE — Patient Instructions (Signed)
Stay on the present blood pressure medication Take 2 Aleve twice per day regularly.  Do this for the next several weeks If you get a headache try 1 of these pills and let me know how it does.

## 2020-02-08 NOTE — Progress Notes (Signed)
   Subjective:    Patient ID: Edward Gillespie, male    DOB: 05/28/1966, 54 y.o.   MRN: [id number]  HPI He is here for a recheck.  He was seen recently by neurology.  I did review that note and went over the data with him.  He does complain of an intermittent headache that is usually one-sided throbbing with associated fatigue and some slight dizziness.  It lasts roughly 20 minutes.  There is no associated nausea, photophobia, phonophobia, forehead swelling, blurred or double vision.  He gets 2 or 3 of these a week.  He does not drink.  He is concerned if this is blood pressure related and has been checking his blood pressure when he has a headaches which does show an elevation.   Review of Systems     Objective:   Physical Exam Alert and in no distress.  EOMI.  No tenderness over temporal artery.  No palpable lesion to the scalp.  No carotid bruit.  Cardiac exam shows regular rhythm without murmurs or gallops.  Lungs are clear to auscultation.      Assessment & Plan:  Headache, variant migraine The symptoms are more of a migraine variant type of pattern although the true etiology is not quite clear.  I will have him use 2 Aleve twice per day regularly.  Also gave him a sample of Nurtec ODT with instructions to use this once to see if that will help.  He is to touch bases with me in several weeks. He is concerned over blood pressure causing this and I explained that that that is not the case.  His blood pressure today is quite good.

## 2020-02-09 LAB — URINE CYTOLOGY ANCILLARY ONLY
Chlamydia: NEGATIVE
Comment: NEGATIVE
Comment: NORMAL
Neisseria Gonorrhea: NEGATIVE

## 2020-02-09 LAB — T-HELPER CELL (CD4) - (RCID CLINIC ONLY)
CD4 % Helper T Cell: 37 % (ref 33–65)
CD4 T Cell Abs: 582 /uL (ref 400–1790)

## 2020-02-10 LAB — CBC WITH DIFFERENTIAL/PLATELET
Absolute Monocytes: 583 cells/uL (ref 200–950)
Basophils Absolute: 42 cells/uL (ref 0–200)
Basophils Relative: 0.8 %
Eosinophils Absolute: 302 cells/uL (ref 15–500)
Eosinophils Relative: 5.7 %
HCT: 46.8 % (ref 38.5–50.0)
Hemoglobin: 16 g/dL (ref 13.2–17.1)
Lymphs Abs: 1590 cells/uL (ref 850–3900)
MCH: 31.1 pg (ref 27.0–33.0)
MCHC: 34.2 g/dL (ref 32.0–36.0)
MCV: 90.9 fL (ref 80.0–100.0)
MPV: 10 fL (ref 7.5–12.5)
Monocytes Relative: 11 %
Neutro Abs: 2783 cells/uL (ref 1500–7800)
Neutrophils Relative %: 52.5 %
Platelets: 282 10*3/uL (ref 140–400)
RBC: 5.15 10*6/uL (ref 4.20–5.80)
RDW: 12.8 % (ref 11.0–15.0)
Total Lymphocyte: 30 %
WBC: 5.3 10*3/uL (ref 3.8–10.8)

## 2020-02-10 LAB — LIPID PANEL
Cholesterol: 154 mg/dL (ref <200)
HDL: 38 mg/dL — ABNORMAL LOW (ref >=40)
LDL Cholesterol (Calc): 74 mg/dL (calc)
Non-HDL Cholesterol (Calc): 116 mg/dL (calc) (ref <130)
Total CHOL/HDL Ratio: 4.1 (calc) (ref <5.0)
Triglycerides: 352 mg/dL — ABNORMAL HIGH (ref <150)

## 2020-02-10 LAB — COMPLETE METABOLIC PANEL WITH GFR
AG Ratio: 2.3 (calc) (ref 1.0–2.5)
ALT: 28 U/L (ref 9–46)
AST: 20 U/L (ref 10–35)
Albumin: 4.5 g/dL (ref 3.6–5.1)
Alkaline phosphatase (APISO): 50 U/L (ref 35–144)
BUN/Creatinine Ratio: 17 (calc) (ref 6–22)
BUN: 25 mg/dL (ref 7–25)
CO2: 28 mmol/L (ref 20–32)
Calcium: 9.8 mg/dL (ref 8.6–10.3)
Chloride: 104 mmol/L (ref 98–110)
Creat: 1.44 mg/dL — ABNORMAL HIGH (ref 0.70–1.33)
GFR, Est African American: 64 mL/min/{1.73_m2} (ref >=60)
GFR, Est Non African American: 55 mL/min/{1.73_m2} — ABNORMAL LOW (ref >=60)
Globulin: 2 g/dL (calc) (ref 1.9–3.7)
Glucose, Bld: 78 mg/dL (ref 65–99)
Potassium: 4.6 mmol/L (ref 3.5–5.3)
Sodium: 142 mmol/L (ref 135–146)
Total Bilirubin: 0.5 mg/dL (ref 0.2–1.2)
Total Protein: 6.5 g/dL (ref 6.1–8.1)

## 2020-02-10 LAB — HIV-1 RNA QUANT-NO REFLEX-BLD
HIV 1 RNA Quant: <20 Copies/mL — ABNORMAL HIGH
HIV-1 RNA Quant, Log: <1.3 Log cps/mL — ABNORMAL HIGH

## 2020-02-10 LAB — RPR: RPR Ser Ql: NONREACTIVE

## 2020-02-16 ENCOUNTER — Telehealth: Payer: Self-pay

## 2020-02-16 NOTE — Telephone Encounter (Signed)
Called patient using [employer] to instruct patient to hold HCTZ for 1 week and for lab recheck. Left voicemail requesting to call office back to review labs and provider instructions.   Per Dr. Daiva Eves, patient needs to hold his diuretic, hydrate for a week and have BMP rechecked for creatinine monitoring.   Forwarding to triage.

## 2020-02-16 NOTE — Telephone Encounter (Signed)
-----   Message from Randall Hiss, MD sent at 02/16/2020  8:59 AM EDT ----- I would do a week and repeat BMP ----- Message ----- From: Rosanna Randy, RN Sent: 02/14/2020   2:53 PM EDT To: Randall Hiss, MD  How long do you want his diuretic held? After that, when do you want labs drawn? Thanks!  ----- Message ----- From: Daiva Eves, Lisette Grinder, MD Sent: 02/13/2020   9:21 AM EDT To: [facility] Triage Nurse Pool  Pts creatinine a bit up. Can he hold his diuretic, hydrate and have BMP rechecked

## 2020-02-21 ENCOUNTER — Other Ambulatory Visit: Payer: Self-pay

## 2020-02-21 ENCOUNTER — Other Ambulatory Visit: Payer: 59

## 2020-02-21 DIAGNOSIS — B20 Human immunodeficiency virus [HIV] disease: Secondary | ICD-10-CM

## 2020-02-21 NOTE — Progress Notes (Signed)
Repeat BMP per Dr. Daiva Eves [facility]

## 2020-02-22 LAB — BASIC METABOLIC PANEL
BUN: 22 mg/dL (ref 7–25)
CO2: 27 mmol/L (ref 20–32)
Calcium: 9.8 mg/dL (ref 8.6–10.3)
Chloride: 105 mmol/L (ref 98–110)
Creat: 1.19 mg/dL (ref 0.70–1.33)
Glucose, Bld: 90 mg/dL (ref 65–99)
Potassium: 4.5 mmol/L (ref 3.5–5.3)
Sodium: 139 mmol/L (ref 135–146)

## 2020-02-28 ENCOUNTER — Other Ambulatory Visit: Payer: Self-pay | Admitting: Infectious Disease

## 2020-02-28 ENCOUNTER — Other Ambulatory Visit: Payer: Self-pay

## 2020-02-28 ENCOUNTER — Ambulatory Visit: Payer: 59 | Admitting: Family Medicine

## 2020-02-28 ENCOUNTER — Telehealth: Payer: Self-pay

## 2020-02-28 VITALS — BP 120/82 | HR 82 | Temp 96.3°F | Wt 207.2 lb

## 2020-02-28 DIAGNOSIS — G4700 Insomnia, unspecified: Secondary | ICD-10-CM

## 2020-02-28 DIAGNOSIS — I10 Essential (primary) hypertension: Secondary | ICD-10-CM | POA: Diagnosis not present

## 2020-02-28 DIAGNOSIS — Z23 Encounter for immunization: Secondary | ICD-10-CM | POA: Diagnosis not present

## 2020-02-28 DIAGNOSIS — B20 Human immunodeficiency virus [HIV] disease: Secondary | ICD-10-CM

## 2020-02-28 DIAGNOSIS — R42 Dizziness and giddiness: Secondary | ICD-10-CM

## 2020-02-28 MED ORDER — LOSARTAN POTASSIUM-HCTZ 50-12.5 MG PO TABS: 1.0000 | ORAL_TABLET | Freq: Every day | ORAL | 3 refills | Status: DC

## 2020-02-28 NOTE — Progress Notes (Signed)
   Subjective:    Patient ID: Edward Gillespie, male    DOB: 1966/03/26, 54 y.o.   MRN: [id number]  HPI He is here for recheck.  He originally injured himself in a syncopal episode in early August.  He states that the Aleve has helped the headache reducing it by roughly 50% but he is now having more difficulty with dizziness as well as fatigue.  No blurred or double vision.  He has also been checking his blood pressure and his machine has been calibrated against ours.  The numbers do show elevated blood pressure. At the present time he is not having any trouble with dizziness.  Review of Systems     Objective:   Physical Exam Alert and in no distress.  EOMI.  Other cranial nerves grossly intact.  Cerebellar testing does show him to be unsteady on his gait and he does tend to fall backwards.  DTRs are normal.  No clonus is noted.  Normal finger-to-nose.       Assessment & Plan:  Vertigo - Plan: MR Brain Wo Contrast  Essential hypertension - Plan: losartan-hydrochlorothiazide (HYZAAR) 50-12.5 MG tablet He now seems to be much bothered by dizziness and fatigue and does tend to fall backwards when doing cerebellar testing. If we cannot get the MRI then I will refer to neurology. I again stated that I did not think the dizziness was related to his blood pressure especially since his pressure right now is normal and he states that he is not dizzy at the present moment.  Return visit here will depend on the MRI and possible referral to neurology

## 2020-02-28 NOTE — Telephone Encounter (Signed)
Pt. Aware.

## 2020-02-28 NOTE — Telephone Encounter (Signed)
Use the Aleve on an as-needed basis.

## 2020-02-28 NOTE — Telephone Encounter (Signed)
Pts. Friend called back wanting to know if he is supposed to continue the Aleve twice daily for the head aches or is one of the new medications he is taking to help with his head aches. He can be reached at [phone].

## 2020-02-28 NOTE — Addendum Note (Signed)
Addended by: Renelda Loma on: 02/28/2020 02:25 PM   Modules accepted: Orders

## 2020-03-05 ENCOUNTER — Ambulatory Visit: Payer: 59 | Admitting: Infectious Disease

## 2020-03-05 ENCOUNTER — Encounter: Payer: Self-pay | Admitting: Infectious Disease

## 2020-03-05 ENCOUNTER — Other Ambulatory Visit: Payer: Self-pay

## 2020-03-05 VITALS — BP 119/80 | HR 83 | Temp 97.7°F | Resp 16 | Ht 67.0 in | Wt 206.2 lb

## 2020-03-05 DIAGNOSIS — R42 Dizziness and giddiness: Secondary | ICD-10-CM

## 2020-03-05 DIAGNOSIS — I10 Essential (primary) hypertension: Secondary | ICD-10-CM

## 2020-03-05 DIAGNOSIS — B20 Human immunodeficiency virus [HIV] disease: Secondary | ICD-10-CM | POA: Diagnosis not present

## 2020-03-05 DIAGNOSIS — R55 Syncope and collapse: Secondary | ICD-10-CM

## 2020-03-05 DIAGNOSIS — R85610 Atypical squamous cells of undetermined significance on cytologic smear of anus (ASC-US): Secondary | ICD-10-CM

## 2020-03-05 HISTORY — DX: Syncope and collapse: R55

## 2020-03-05 HISTORY — DX: Essential (primary) hypertension: I10

## 2020-03-05 MED ORDER — BIKTARVY 50-200-25 MG PO TABS: 1.0000 | ORAL_TABLET | Freq: Every day | ORAL | 11 refills | Status: DC
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
## 2020-03-06 ENCOUNTER — Telehealth: Payer: Self-pay | Admitting: Family Medicine

## 2020-03-06 ENCOUNTER — Ambulatory Visit: Payer: 59 | Admitting: Family Medicine

## 2020-03-06 NOTE — Telephone Encounter (Signed)
Lets give him a note for a couple weeks just to be safe.  Let him know that he should have been scheduled with cardiology through the ER but apparently that did not occur.  We need both cardiology and the MRI

## 2020-03-06 NOTE — Telephone Encounter (Signed)
Husband called & states Losartan is helping with BP but dizziness and fatigue is worse,  Waiting on MRI and also he went to HIV doctor yesterday and he is concerned also and he is sending him to Cardiologist.  Pt is having issues at work with lifting and they are rushing him and he is getting out of breath and more dizzy.  They would like a note restricting him to lift no more than 10 pounds and to be able to work at his own pace without being rushed.

## 2020-03-07 ENCOUNTER — Encounter: Payer: Self-pay | Admitting: Internal Medicine

## 2020-03-07 ENCOUNTER — Encounter: Payer: Self-pay | Admitting: Family Medicine

## 2020-03-07 ENCOUNTER — Other Ambulatory Visit: Payer: Self-pay

## 2020-03-07 ENCOUNTER — Ambulatory Visit: Payer: 59 | Admitting: Internal Medicine

## 2020-03-07 VITALS — BP 120/68 | HR 73 | Ht 67.0 in | Wt 208.0 lb

## 2020-03-07 DIAGNOSIS — B20 Human immunodeficiency virus [HIV] disease: Secondary | ICD-10-CM | POA: Diagnosis not present

## 2020-03-07 DIAGNOSIS — I10 Essential (primary) hypertension: Secondary | ICD-10-CM

## 2020-03-07 DIAGNOSIS — R55 Syncope and collapse: Secondary | ICD-10-CM
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
## 2020-03-07 NOTE — Telephone Encounter (Signed)
Letter typed, left message.  Appts for MRI & Cardiologist have been made

## 2020-03-07 NOTE — Patient Instructions (Signed)
Medication Instructions:  NO CHANGES *If you need a refill on your cardiac medications before your next appointment, please call your pharmacy*   Lab Work: NONE If you have labs (blood work) drawn today and your tests are completely normal, you will receive your results only by: Marland Kitchen MyChart Message (if you have MyChart) OR . A paper copy in the mail If you have any lab test that is abnormal or we need to change your treatment, we will call you to review the results.   Testing/Procedures: Your physician has requested that you have an echocardiogram. Echocardiography is a painless test that uses sound waves to create images of your heart. It provides your doctor with information about the size and shape of your heart and how well your heart's chambers and valves are working. This procedure takes approximately one hour. There are no restrictions for this procedure.   Your physician has recommended that you wear an event monitor. Event monitors are medical devices that record the heart's electrical activity. Doctors most often [place] these monitors to diagnose arrhythmias. Arrhythmias are problems with the speed or rhythm of the heartbeat. The monitor is a small, portable device. You can wear one while you do your normal daily activities. This is usually used to diagnose what is causing palpitations/syncope (passing out).  14 DAY ZIO PATCH   Follow-Up: At [facility], you and your health needs are our priority.  As part of our continuing mission to provide you with exceptional heart care, we have created designated Provider Care Teams.  These Care Teams include your primary Cardiologist (physician) and Advanced Practice Providers (APPs -  Physician Assistants and Nurse Practitioners) who all work together to provide you with the care you need, when you need it.  We recommend signing up for the patient portal called "MyChart".  Sign up information is provided on this After Visit Summary.  MyChart is  used to connect with patients for Virtual Visits (Telemedicine).  Patients are able to view lab/test results, encounter notes, upcoming appointments, etc.  Non-urgent messages can be sent to your provider as well.   To learn more about what you can do with MyChart, go to [email or web address].    Your next appointment:   6 month(s)  The format for your next appointment:   In Person  Provider:   Riley Lam, MD   Other Instructions

## 2020-03-12 ENCOUNTER — Other Ambulatory Visit: Payer: Self-pay

## 2020-03-12 ENCOUNTER — Ambulatory Visit (HOSPITAL_COMMUNITY)
Admission: RE | Admit: 2020-03-12 | Discharge: 2020-03-12 | Disposition: A | Discharge status: Home / Self Care | Payer: 59 | Source: Ambulatory Visit | Attending: Family Medicine | Admitting: Family Medicine

## 2020-03-12 DIAGNOSIS — R42 Dizziness and giddiness: Secondary | ICD-10-CM | POA: Diagnosis not present

## 2020-03-22 ENCOUNTER — Other Ambulatory Visit: Payer: Self-pay

## 2020-03-22 ENCOUNTER — Ambulatory Visit (HOSPITAL_COMMUNITY): Payer: 59 | Attending: Internal Medicine

## 2020-03-22 DIAGNOSIS — I10 Essential (primary) hypertension: Secondary | ICD-10-CM | POA: Diagnosis not present

## 2020-03-22 DIAGNOSIS — R55 Syncope and collapse: Secondary | ICD-10-CM

## 2020-03-22 LAB — ECHOCARDIOGRAM COMPLETE
Area-P 1/2: 2.34 cm2
S' Lateral: 3.1 cm

## 2020-03-23 ENCOUNTER — Ambulatory Visit (INDEPENDENT_AMBULATORY_CARE_PROVIDER_SITE_OTHER): Payer: 59

## 2020-03-23 DIAGNOSIS — R55 Syncope and collapse: Secondary | ICD-10-CM | POA: Diagnosis not present

## 2020-03-23 DIAGNOSIS — I10 Essential (primary) hypertension: Secondary | ICD-10-CM | POA: Diagnosis not present

## 2020-03-27 ENCOUNTER — Ambulatory Visit: Payer: 59 | Admitting: Family Medicine

## 2020-03-27 ENCOUNTER — Encounter: Payer: Self-pay | Admitting: Family Medicine

## 2020-03-27 ENCOUNTER — Other Ambulatory Visit: Payer: Self-pay

## 2020-03-27 VITALS — BP 138/90 | HR 78 | Temp 96.4°F | Wt 211.0 lb

## 2020-03-27 DIAGNOSIS — I10 Essential (primary) hypertension: Secondary | ICD-10-CM

## 2020-03-27 DIAGNOSIS — R42 Dizziness and giddiness: Secondary | ICD-10-CM | POA: Diagnosis not present

## 2020-03-27 NOTE — Progress Notes (Signed)
   Subjective:    Patient ID: Edward Gillespie, male    DOB: [birth year], 54 y.o.   MRN: [id number]  HPI He is here for follow-up visit.  Apparently he did pass out yesterday at work when he was having episodes of dizziness and palpitations.  He presently does have a cardiac monitor on to evaluate that status.  It was placed on Friday and since then he has had multiple episodes of dizziness as well as palpitations.  Further discussion with him concerning the palpitations actually indicates it is not chest related but he feels a throbbing sensation in his head and when he checks this with his pulse, they are not synchronous.  He continues on his blood pressure medication and is comfortable with that now. He did have an interpreter with him.  Edward Gillespie  Review of Systems     Objective:   Physical Exam Alert and in no distress.  Cardiac exam shows regular rhythm without murmurs or gallops.  Lungs are clear to auscultation.  Blood pressure is normal.       Assessment & Plan:  Dizziness  Essential hypertension Although he describes them as palpitations, they are really not chest related but head related and more of a throbbing sensation however they go away very quickly which makes migraine variant unlikely.  I did send a email to his cardiologist concerning the fact that he has had multiple episodes and might not need a full 2 weeks worth of monitoring.

## 2020-03-29 ENCOUNTER — Telehealth: Payer: Self-pay

## 2020-03-29 NOTE — Telephone Encounter (Signed)
Spoke to pt and advised of sensor that can be sent back early. KH

## 2020-03-29 NOTE — Telephone Encounter (Signed)
Spoke to [place] ,pt husband .  Advised that when insurance company was called for a authorization it was told to me that no auth was needed. Gave pt the date and time of call. Pt husband stated that he was told no imaging of any kind was covered under pt policy. Pt husband stated that he would begin the appeal process. I advised that I will ask my office manger for more info to see if she can help. KH

## 2020-03-29 NOTE — Telephone Encounter (Signed)
-----   Message from Ronnald Nian, MD sent at 03/28/2020  8:58 AM EDT ----- Make sure that the patient know he can send that the sensor back to the cardiologist now if you wants ----- Message ----- From: Christell Constant, MD Sent: 03/28/2020   7:17 AM EDT To: Ronnald Nian, MD  He can certainly send it back if he has had the sx.  We will be able to see what is going on with his heart during these episodes; and I'll get the results back soon.  Thanks for the catch,  Mahesh ----- Message ----- From: Ronnald Nian, MD Sent: 03/27/2020   3:32 PM EDT To: Christell Constant, MD  I saw the patient in follow-up today and he has had multiple episodes of dizziness and palpitations and kept a record of it.  He is supposed to have the monitor for 2 weeks but since he has had multiple episodes and wondering if he needs to continue. Thanks for your help, Sharlot Gowda

## 2020-04-03 ENCOUNTER — Other Ambulatory Visit: Payer: Self-pay | Admitting: Infectious Disease

## 2020-04-03 DIAGNOSIS — G4700 Insomnia, unspecified: Secondary | ICD-10-CM

## 2020-04-03 DIAGNOSIS — B20 Human immunodeficiency virus [HIV] disease: Secondary | ICD-10-CM

## 2020-04-03 NOTE — Progress Notes (Signed)
Thanks for seeing him! 

## 2020-04-04 ENCOUNTER — Other Ambulatory Visit: Payer: Self-pay | Admitting: Infectious Disease

## 2020-04-05 ENCOUNTER — Telehealth: Payer: Self-pay | Admitting: Family Medicine

## 2020-04-05 NOTE — Telephone Encounter (Signed)
Returned call to [place].  Selena Batten thought I had worked in [employer] in my past, I worked in Audiological scientist.  So I explained to [place] he would need to follow the appeal process.  That when Selena Batten called for prior auth for the procedure she was told no prior auth needed.  Also request patient contact their insurance agent.  He said ok.

## 2020-05-03 ENCOUNTER — Ambulatory Visit: Payer: 59 | Admitting: Internal Medicine

## 2020-05-03 ENCOUNTER — Encounter (HOSPITAL_COMMUNITY): Payer: Self-pay | Admitting: *Deleted

## 2020-05-03 ENCOUNTER — Encounter: Payer: Self-pay | Admitting: Internal Medicine

## 2020-05-03 ENCOUNTER — Telehealth (HOSPITAL_COMMUNITY): Payer: Self-pay | Admitting: *Deleted

## 2020-05-03 ENCOUNTER — Other Ambulatory Visit: Payer: Self-pay

## 2020-05-03 VITALS — BP 136/84 | HR 88 | Ht 67.0 in | Wt 212.0 lb

## 2020-05-03 DIAGNOSIS — I493 Ventricular premature depolarization: Secondary | ICD-10-CM

## 2020-05-03 DIAGNOSIS — I10 Essential (primary) hypertension: Secondary | ICD-10-CM

## 2020-05-03 DIAGNOSIS — B20 Human immunodeficiency virus [HIV] disease: Secondary | ICD-10-CM

## 2020-05-03 DIAGNOSIS — R55 Syncope and collapse: Secondary | ICD-10-CM

## 2020-05-03 NOTE — Patient Instructions (Signed)
Medication Instructions:  Your physician recommends that you continue on your current medications as directed. Please refer to the Current Medication list given to you today.  *If you need a refill on your cardiac medications before your next appointment, please call your pharmacy*  Lab Work: None ordered today  Testing/Procedures: Your physician has requested that you have a lexiscan myoview. For further information please visit [email or web address] Please follow instruction sheet, as given.  Follow-Up: At [facility], you and your health needs are our priority.  As part of our continuing mission to provide you with exceptional heart care, we have created designated Provider Care Teams.  These Care Teams include your primary Cardiologist (physician) and Advanced Practice Providers (APPs -  Physician Assistants and Nurse Practitioners) who all work together to provide you with the care you need, when you need it.  Your next appointment:   4 month(s)  The format for your next appointment:   In Person  Provider:   Riley Lam, MD
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
## 2020-05-03 NOTE — Telephone Encounter (Signed)
Left message on voicemail, using language line, per DPR in reference to upcoming appointment scheduled on 05/09/20 with detailed instructions given per Myocardial Perfusion Study Information Sheet for the test. LM to arrive 15 minutes early, and that it is imperative to arrive on time for appointment to keep from having the test rescheduled. If you need to cancel or reschedule your appointment, please call the office within 24 hours of your appointment. Failure to do so may result in a cancellation of your appointment, and a $50 no show fee. Phone number given for call back for any questions. Ricky Ala

## 2020-05-09 ENCOUNTER — Ambulatory Visit (HOSPITAL_COMMUNITY): Payer: 59 | Attending: Internal Medicine

## 2020-05-09 ENCOUNTER — Other Ambulatory Visit: Payer: Self-pay

## 2020-05-09 VITALS — Ht 67.0 in | Wt 212.0 lb

## 2020-05-09 DIAGNOSIS — R110 Nausea: Secondary | ICD-10-CM | POA: Diagnosis present

## 2020-05-09 DIAGNOSIS — R55 Syncope and collapse: Secondary | ICD-10-CM | POA: Diagnosis not present

## 2020-05-09 LAB — MYOCARDIAL PERFUSION IMAGING
LV dias vol: 88 mL (ref 62–150)
LV sys vol: 33 mL
Peak HR: 102 {beats}/min
Rest HR: 63 {beats}/min
SDS: 2
SRS: 0
SSS: 2
TID: 0.94

## 2020-05-09 MED ORDER — REGADENOSON 0.4 MG/5ML IV SOLN
0.4000 mg | Freq: Once | INTRAVENOUS | Status: AC
  Administered 2020-05-09: 0.4 mg via INTRAVENOUS

## 2020-05-09 MED ORDER — AMINOPHYLLINE 25 MG/ML IV SOLN
150.0000 mg | Freq: Once | INTRAVENOUS | Status: AC
  Administered 2020-05-09: 150 mg via INTRAVENOUS

## 2020-05-09 MED ORDER — TECHNETIUM TC 99M TETROFOSMIN IV KIT
32.8000 | PACK | Freq: Once | INTRAVENOUS | Status: AC | PRN
  Administered 2020-05-09: 32.8 via INTRAVENOUS
  Filled 2020-05-09: qty 33

## 2020-05-09 MED ORDER — TECHNETIUM TC 99M TETROFOSMIN IV KIT
10.9000 | PACK | Freq: Once | INTRAVENOUS | Status: AC | PRN
  Administered 2020-05-09: 10.9 via INTRAVENOUS
  Filled 2020-05-09: qty 11

## 2020-05-31 ENCOUNTER — Telehealth: Payer: Self-pay | Admitting: Family Medicine

## 2020-05-31 NOTE — Telephone Encounter (Signed)
Lars Mage called, he called [facility] [facility] and they were no help. Juan called [facility] billing and she could see in the chart where no Prior auth needed and she suggested pt get release of info and send those documents to [facility] with a 2nd appeal.  I am sending Release to [place] for patient to sign and return.

## 2020-05-31 NOTE — Telephone Encounter (Signed)
Flonnie Hailstone the pt husband called and left message to call him regarding the $3,000.00 bill he has for the MRI.  I reviewed the chart again.   When Kim called to schedule the MRI, she called first for the Prior Auth.  She was told no PRIOR AUTH needed.  This was approximately 8:34ish on 9/15.  Then the MRI was scheduled by Karna Christmas in the system.  Order # [id number]  Referral# [id number].    MRI performed at [facility] CT on 03/12/20 1:36 pm, checked in by Danelle Waters There is a note of Prepayment due 690.98.  Not sure how this amount was arrived by insurance or by normal % that a patient would owe.  1.  Not sure who discussed payment, copay or coverage with patient. 2.  When did patient find out no coverage? 3.  Who at office of MRI verified coverage? 4.  Did insurance agent tell patient coverage available for MRI? 5.  Did insurance company tell patient coverage available for MRI?  Lots of unknown questions.  Kim did not nor does not verify coverage for proceedures, she only verified if a prior auth was needed for procedure and was told PRIOR AUTH NOT NEEDED.  So she continued on with process.  Returned call to [employer] [phone] reached his voice mail [email or web address].  It is apparent he will need to follow up with his insurance carrier and agent.

## 2020-06-14 ENCOUNTER — Other Ambulatory Visit: Payer: Self-pay

## 2020-06-14 ENCOUNTER — Ambulatory Visit: Payer: 59 | Admitting: Family Medicine

## 2020-06-14 ENCOUNTER — Encounter: Payer: Self-pay | Admitting: Family Medicine

## 2020-06-14 VITALS — BP 120/76 | HR 78 | Temp 98.3°F | Wt 222.2 lb

## 2020-06-14 DIAGNOSIS — R5383 Other fatigue: Secondary | ICD-10-CM

## 2020-06-14 DIAGNOSIS — R42 Dizziness and giddiness: Secondary | ICD-10-CM

## 2020-06-14 DIAGNOSIS — Z23 Encounter for immunization: Secondary | ICD-10-CM | POA: Diagnosis not present

## 2020-06-14 DIAGNOSIS — I493 Ventricular premature depolarization: Secondary | ICD-10-CM

## 2020-06-14 NOTE — Progress Notes (Signed)
   Subjective:    Patient ID: Edward Gillespie, male    DOB: March 09, 1966, 54 y.o.   MRN: [id number]  HPI He is here for a recheck. He states that the fatigue and dizziness had greatly improved but he still does have it to a lesser degree. Review of his medical record indicates that he has been seen in the emergency room and subsequently seen by neurology as well as cardiology. Those records indicate no major pathologic issues other than occasional PVCs. He has had no fever, chills, headache, GI symptoms. No complaints with libido, skin or hair changes. Immunizations reviewed. Tdap is not listed but he moved here in 2014 so he should be up-to-date moving here from [place].  Review of Systems     Objective:   Physical Exam Alert and in no distress. DTR's 1+Tympanic membranes and canals are normal. Pharyngeal area is normal. Neck is supple without adenopathy or thyromegaly. Cardiac exam shows a regular sinus rhythm without murmurs or gallops. Lungs are clear to auscultation.        Assessment & Plan:  Fatigue, unspecified type - Plan: CBC with Differential/Platelet, Comprehensive metabolic panel, TSH, Testosterone, Sedimentation rate  Dizziness - Plan: CBC with Differential/Platelet, Comprehensive metabolic panel, TSH, Testosterone, Sedimentation rate  Need for vaccination against Streptococcus pneumoniae - Plan: Pneumococcal polysaccharide vaccine 23-valent greater than or equal to 2yo subcutaneous/IM  PVC (premature ventricular contraction) I think is reasonable to recheck his blood work and also thyroid as well as testosterone. 35 minutes spent in reviewing his medical record, history, exam, documentation and dictation.

## 2020-06-15 LAB — CBC WITH DIFFERENTIAL/PLATELET
Basophils Absolute: 0.1 10*3/uL (ref 0.0–0.2)
Basos: 1 %
EOS (ABSOLUTE): 0.3 10*3/uL (ref 0.0–0.4)
Eos: 6 %
Hematocrit: 50.7 % (ref 37.5–51.0)
Hemoglobin: 16.9 g/dL (ref 13.0–17.7)
Immature Grans (Abs): 0 10*3/uL (ref 0.0–0.1)
Immature Granulocytes: 0 %
Lymphocytes Absolute: 1.4 10*3/uL (ref 0.7–3.1)
Lymphs: 29 %
MCH: 29.8 pg (ref 26.6–33.0)
MCHC: 33.3 g/dL (ref 31.5–35.7)
MCV: 89 fL (ref 79–97)
Monocytes Absolute: 0.6 10*3/uL (ref 0.1–0.9)
Monocytes: 13 %
Neutrophils Absolute: 2.5 10*3/uL (ref 1.4–7.0)
Neutrophils: 51 %
Platelets: 262 10*3/uL (ref 150–450)
RBC: 5.68 x10E6/uL (ref 4.14–5.80)
RDW: 12.4 % (ref 11.6–15.4)
WBC: 4.9 10*3/uL (ref 3.4–10.8)

## 2020-06-15 LAB — COMPREHENSIVE METABOLIC PANEL
ALT: 76 IU/L — ABNORMAL HIGH (ref 0–44)
AST: 36 IU/L (ref 0–40)
Albumin/Globulin Ratio: 2.1 (ref 1.2–2.2)
Albumin: 4.6 g/dL (ref 3.8–4.9)
Alkaline Phosphatase: 64 IU/L (ref 44–121)
BUN/Creatinine Ratio: 19 (ref 9–20)
BUN: 23 mg/dL (ref 6–24)
Bilirubin Total: 0.4 mg/dL (ref 0.0–1.2)
CO2: 23 mmol/L (ref 20–29)
Calcium: 9.7 mg/dL (ref 8.7–10.2)
Chloride: 104 mmol/L (ref 96–106)
Creatinine, Ser: 1.18 mg/dL (ref 0.76–1.27)
GFR calc Af Amer: 80 mL/min/{1.73_m2} (ref >59)
GFR calc non Af Amer: 70 mL/min/{1.73_m2} (ref >59)
Globulin, Total: 2.2 g/dL (ref 1.5–4.5)
Glucose: 93 mg/dL (ref 65–99)
Potassium: 4.6 mmol/L (ref 3.5–5.2)
Sodium: 141 mmol/L (ref 134–144)
Total Protein: 6.8 g/dL (ref 6.0–8.5)

## 2020-06-15 LAB — TESTOSTERONE: Testosterone: 426 ng/dL (ref 264–916)

## 2020-06-15 LAB — TSH: TSH: 0.39 u[IU]/mL — ABNORMAL LOW (ref 0.450–4.500)

## 2020-06-15 LAB — SEDIMENTATION RATE: Sed Rate: 2 mm/hr (ref 0–30)

## 2020-06-19 LAB — SPECIMEN STATUS REPORT

## 2020-06-19 LAB — T3 UPTAKE
Free Thyroxine Index: 3.4 (ref 1.2–4.9)
T3 Uptake Ratio: 34 % (ref 24–39)

## 2020-06-19 LAB — T4: T4, Total: 10.1 ug/dL (ref 4.5–12.0)

## 2020-07-19 ENCOUNTER — Other Ambulatory Visit: Payer: Self-pay

## 2020-07-19 DIAGNOSIS — Z113 Encounter for screening for infections with a predominantly sexual mode of transmission: Secondary | ICD-10-CM

## 2020-07-19 DIAGNOSIS — Z79899 Other long term (current) drug therapy: Secondary | ICD-10-CM

## 2020-07-19 DIAGNOSIS — B20 Human immunodeficiency virus [HIV] disease: Secondary | ICD-10-CM

## 2020-07-24 ENCOUNTER — Other Ambulatory Visit (HOSPITAL_COMMUNITY)
Admission: RE | Admit: 2020-07-24 | Discharge: 2020-07-24 | Disposition: A | Discharge status: Home / Self Care | Payer: 59 | Source: Ambulatory Visit | Attending: Infectious Disease | Admitting: Infectious Disease

## 2020-07-24 ENCOUNTER — Other Ambulatory Visit: Payer: 59

## 2020-07-24 ENCOUNTER — Other Ambulatory Visit: Payer: Self-pay

## 2020-07-24 DIAGNOSIS — Z113 Encounter for screening for infections with a predominantly sexual mode of transmission: Secondary | ICD-10-CM | POA: Insufficient documentation

## 2020-07-24 DIAGNOSIS — B20 Human immunodeficiency virus [HIV] disease: Secondary | ICD-10-CM

## 2020-07-25 LAB — URINE CYTOLOGY ANCILLARY ONLY
Chlamydia: NEGATIVE
Comment: NEGATIVE
Comment: NORMAL
Neisseria Gonorrhea: NEGATIVE

## 2020-07-25 LAB — T-HELPER CELL (CD4) - (RCID CLINIC ONLY)
CD4 % Helper T Cell: 39 % (ref 33–65)
CD4 T Cell Abs: 594 /uL (ref 400–1790)

## 2020-07-27 LAB — COMPLETE METABOLIC PANEL WITH GFR
AG Ratio: 2 (calc) (ref 1.0–2.5)
ALT: 43 U/L (ref 9–46)
AST: 25 U/L (ref 10–35)
Albumin: 4.8 g/dL (ref 3.6–5.1)
Alkaline phosphatase (APISO): 57 U/L (ref 35–144)
BUN: 22 mg/dL (ref 7–25)
CO2: 28 mmol/L (ref 20–32)
Calcium: 10 mg/dL (ref 8.6–10.3)
Chloride: 104 mmol/L (ref 98–110)
Creat: 1.2 mg/dL (ref 0.70–1.33)
GFR, Est African American: 79 mL/min/{1.73_m2} (ref >=60)
GFR, Est Non African American: 68 mL/min/{1.73_m2} (ref >=60)
Globulin: 2.4 g/dL (calc) (ref 1.9–3.7)
Glucose, Bld: 90 mg/dL (ref 65–99)
Potassium: 3.9 mmol/L (ref 3.5–5.3)
Sodium: 140 mmol/L (ref 135–146)
Total Bilirubin: 0.5 mg/dL (ref 0.2–1.2)
Total Protein: 7.2 g/dL (ref 6.1–8.1)

## 2020-07-27 LAB — CBC WITH DIFFERENTIAL/PLATELET
Absolute Monocytes: 651 cells/uL (ref 200–950)
Basophils Absolute: 62 cells/uL (ref 0–200)
Basophils Relative: 1 %
Eosinophils Absolute: 248 cells/uL (ref 15–500)
Eosinophils Relative: 4 %
HCT: 47.5 % (ref 38.5–50.0)
Hemoglobin: 16.6 g/dL (ref 13.2–17.1)
Lymphs Abs: 1562 cells/uL (ref 850–3900)
MCH: 31 pg (ref 27.0–33.0)
MCHC: 34.9 g/dL (ref 32.0–36.0)
MCV: 88.6 fL (ref 80.0–100.0)
MPV: 9.8 fL (ref 7.5–12.5)
Monocytes Relative: 10.5 %
Neutro Abs: 3677 cells/uL (ref 1500–7800)
Neutrophils Relative %: 59.3 %
Platelets: 275 10*3/uL (ref 140–400)
RBC: 5.36 10*6/uL (ref 4.20–5.80)
RDW: 12.8 % (ref 11.0–15.0)
Total Lymphocyte: 25.2 %
WBC: 6.2 10*3/uL (ref 3.8–10.8)

## 2020-07-27 LAB — RPR: RPR Ser Ql: NONREACTIVE

## 2020-07-27 LAB — HIV-1 RNA QUANT-NO REFLEX-BLD
HIV 1 RNA Quant: <20 Copies/mL
HIV-1 RNA Quant, Log: <1.3 Log cps/mL

## 2020-08-07 ENCOUNTER — Encounter: Payer: 59 | Admitting: Infectious Disease

## 2020-08-20 ENCOUNTER — Ambulatory Visit: Payer: 59 | Admitting: Family Medicine

## 2020-08-20 ENCOUNTER — Encounter: Payer: Self-pay | Admitting: Family Medicine

## 2020-08-20 ENCOUNTER — Other Ambulatory Visit: Payer: Self-pay

## 2020-08-20 VITALS — BP 130/80 | HR 64 | Ht 67.0 in | Wt 215.0 lb

## 2020-08-20 DIAGNOSIS — S86912A Strain of unspecified muscle(s) and tendon(s) at lower leg level, left leg, initial encounter: Secondary | ICD-10-CM | POA: Diagnosis not present

## 2020-08-20 MED ORDER — NAPROXEN 500 MG PO TABS: 500.0000 mg | ORAL_TABLET | Freq: Two times a day (BID) | ORAL | 0 refills | Status: DC

## 2020-08-20 NOTE — Patient Instructions (Addendum)
Take naproxen twice daily with food. Take it until your knee pain has completely resolved (may only need to take for 7-10 days, but okay to finish the whole 2 weeks, if needed).  Cut back the dose if it bothers your stomach (take 1/2 tablet).  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 Use the brace when standing at work. Ice the knee and elevated if it is painful and swollen.   Return to see Dr. Susann GivensLalonde in 10-14 days if knee pain persists or worsens.  Do the exercises on the handout to help with the IT band pain.   Sndrome de la banda iliotibial Iliotibial Band Syndrome  El sndrome de la banda iliotibial es una afeccin que causa dolor en la rodilla. Tambin puede causar dolor en la parte externa de la cadera, del muslo y de la rodilla. La banda iliotibial es la franja de tejido en cada una de las piernas. Esta banda se extiende desde la parte exterior de la cadera a lo largo del muslo hasta la parte exterior de la rodilla. Al flexionar y Designer, television/film setestirar la rodilla repetidamente puede irritar la banda iliotibial. Cules son las causas? Esta afeccin es causada por la inflamacin provocada por el roce (friccin) de la banda iliotibial mientras se mueve sobre el hueso del muslo (fmur) al flexionar y Furniture conservator/restorerestirar la rodilla repetidamente. Qu incrementa el riesgo? Es ms probable que sufra esta afeccin si:  Cambia de elevacin a menudo al correr Neomia Dearuna cinta de correr.  Corre distancias muy largas.  Acaba de aumentar la distancia o la intensidad de su actividad fsica. "Intensidad" significa cunto esfuerzo hace.  Suele correr cuesta abajo o comenz a hacerlo recientemente.  Recorre distancias muy largas o con mucha frecuencia en bicicleta. Tambin puede correr un riesgo mayor en los siguientes casos:  Si comienza una nueva rutina de ejercicios sin precalentar sus msculos.  Si tiene un trabajo que lo obliga a flexionarse, ponerse en cuclillas o subir escaleras con  frecuencia. Cules son los signos o sntomas? Los sntomas de esta afeccin incluyen:  Dolor en la parte externa de la rodilla que puede empeorar con la Dixieactividad; en especial, al correr o subir y bajar escaleras.  Sensacin de un chasquido en la rodilla o la cadera.  Hinchazn en la parte externa de la rodilla.  Dolor o sensacin de tensin en la cadera. Cmo se diagnostica? Esta afeccin se diagnostica en funcin de lo siguiente:  Sus sntomas.  Sus antecedentes mdicos.  Un examen fsico. Tambin puede consultar a un mdico que se especialice en la disminucin del dolor y el aumento de la movilidad (fisioterapeuta). Un fisioterapeuta puede hacerle un examen para controlar su equilibrio, movimiento y la forma de caminar o correr Development worker, community(marcha) a fin de determinar si la forma en que se mueve podra contribuir a Multimedia programmerempeorar la lesin. Tambin puede hacerle pruebas para medir la fuerza, la flexibilidad y la amplitud de movimientos. Cmo se trata? El tratamiento para esta afeccin puede incluir lo siguiente:  Tomar antiinflamatorios no esteroideos (AINE), como ibuprofeno, para ayudar a Engineer, materialsaliviar el dolor y la hinchazn.  Hacer reposo y Tech Data Corporationlimitar la actividad fsica.  Retomar las actividades de forma gradual.  Hacer ejercicios para mejorar el movimiento y la fuerza (fisioterapia) como se lo haya indicado el mdico.  Aplicarse una inyeccin de un medicamento esteroideo. Es un medicamento que ayuda a Product/process development scientistaliviar la inflamacin.  Someterse a [place]una ciruga. Esta se puede realizar si sus sntomas no mejoran despus de otros tratamientos.  Siga estas instrucciones en su casa: Control del dolor, la rigidez y la hinchazn Si se lo indican, aplique hielo sobre la zona de la lesin. Para hacer esto:  Ponga el hielo en una bolsa plstica.  Coloque una toalla entre la piel y Copy.  Aplique el hielo durante , 2 o 3veces por da.  Retire el hielo si la piel se pone de color rojo brillante.  Esto es Intel. Si no puede sentir dolor, calor o fro, tiene un mayor riesgo de que se dae la zona.   Actividad  Retome sus actividades normales segn lo indicado por el mdico. Pregntele al mdico qu actividades son seguras para usted.  Incluya actividades fsicas de bajo impacto, como nadar en su rutina de ejercicios.  Consulte al mdico para asegurarse de que correr sea Neomia Dear actividad segura para usted.  Haga ejercicio como se lo haya indicado el mdico. Instrucciones generales  Use los medicamentos de venta libre y los recetados solamente como se lo haya indicado el mdico.  Asegrese de usar calzado que le calce bien y tenga buena amortiguacin y soporte para el arco.  Cumpla con todas las visitas de seguimiento. Esto es importante. Cmo se evita?  Precaliente y elongue adecuadamente antes de hacer actividad fsica.  Reljese y elongue despus de hacer actividad fsica.  Dele al cuerpo tiempo para [employer] perodos de [place] fsica.  Considere la posibilidad de obtener ayuda de un entrenador para Doctor, hospital plan seguro para correr y Research officer, trade union (progreso) del entrenamiento que se ajuste a sus objetivos y capacidad.  Cambie de direccin a menudo mientras corre por [employer].  Reemplace el calzado cuando las suelas estn gastadas.  Mantenga un estado fsico adecuado. Esto incluye fuerza y flexibilidad. Comunquese con un mdico si:  El dolor no mejora.  El dolor empeora an con TEFL teacher. Resumen  El sndrome de la banda iliotibial es una afeccin que causa dolor en la rodilla. Tambin puede causar dolor en la parte externa de la cadera, del muslo y de la rodilla.  El tratamiento incluye tomar antiinflamatorios no esteroideos (AINE), Lawyer, Programme researcher, broadcasting/film/video a las actividades gradualmente y Radio producer ejercicios de fisioterapia.  Retome sus actividades normales segn lo indicado por el mdico. Pregntele al mdico qu actividades son seguras para  usted. Esta informacin no tiene Theme park manager el consejo del mdico. Asegrese de hacerle al mdico cualquier pregunta que tenga. Document Revised: 12/05/2019 Document Reviewed: 12/05/2019 Elsevier Patient Education  2021 Elsevier Inc.  For the following exercises--  Hold each exercise for 10-15 seconds, repeat 10 times, and do them twice daily.   Rehabilitacin para el sndrome de la banda ileotibial Iliotibial Band Syndrome Rehab Pregunte al mdico qu ejercicios son seguros para usted. Haga los ejercicios exactamente como se lo haya indicado el mdico y gradelos como se lo hayan indicado. Es normal sentir un estiramiento leve, tironeo, opresin o Dentist al [employer] ejercicios. Detngase de inmediato si siente un dolor repentino o Armed forces training and education officer. No comience a hacer estos ejercicios hasta que se lo indique el mdico. Ejercicios de elongacin y amplitud de movimiento Estos ejercicios calientan los msculos y las articulaciones, y mejoran el movimiento y la flexibilidad de la cadera y la pelvis. Estiramiento de cudriceps en decbito prono 1. Recustese boca abajo (en decbito prono) sobre una superficie firme, como una cama o un suelo acolchonado. 2. Flexione la rodilla izquierda/derecha y estire la mano hacia atrs para tomarse del tobillo o del pantaln. Si no  puede llegar al Laretta Bolster o al pantaln, tese un cinturn alrededor del pie y agrrelo en Nature conservation officer. 3. Acerque lentamente el taln a las nalgas. La rodilla no deber [employer]. Debe sentir un estiramiento en la parte delantera del muslo y la rodilla (cudriceps). 4. Mantenga esta posicin durante __________ segundos. Repita __________ veces. Realice este ejercicio __________ veces al da.   Estiramiento de la zona ileotibial La zona ileotibial es una banda fuerte de tejido muscular que va desde la parte exterior de la cadera [employer] parte exterior del muslo y la rodilla. 1. Recustese de costado con la  pierna izquierda/derecha arriba. 2. Flexione las rodillas y tmese del tobillo izquierdo/derecho. Estire el brazo de abajo para Pharmacologist el equilibrio. 3. Lleve lentamente la rodilla que est arriba hacia atrs, de modo que el muslo quede detrs del tronco. 4. Baje lentamente la parte superior de la pierna hacia el suelo hasta sentir un ligero estiramiento en la parte externa de la cadera izquierda/derecha. Si no siente un estiramiento y no puede bajar ms la rodilla, coloque el taln del otro pie por encima de la rodilla y empuje la rodilla un poco ms hacia abajo con el pie. 5. Mantenga esta posicin durante __________ segundos. Repita __________ veces. Realice este ejercicio __________ veces al da.   Ejercicios de fortalecimiento Estos ejercicios fortalecen la cadera y la pelvis, y les otorgan resistencia. La resistencia es la capacidad de usar los msculos durante un tiempo prolongado, incluso despus de que se cansen. Elevaciones con pierna extendida, recostado de lado Este ejercicio fortalece los msculos que rotan la pierna a la altura de la cadera y la alejan del cuerpo (abductores de la cadera). 1. Recustese de costado con la pierna izquierda/derecha arriba. Recustese de [employer]	[place] cabeza, los hombros, la cadera y la rodilla estn alineados. Puede flexionar la rodilla de abajo para mantener el equilibrio. 2. Mueva ligeramente las caderas [address], de modo que queden alineadas y la rodilla izquierda/derecha quede hacia adelante. 3. Tensione los msculos de la parte externa del muslo y eleve la pierna que est arriba unas 4 a 6pulgadas (10 a 15cm). 4. Mantenga esta posicin durante __________ segundos. 5. Baje lentamente la pierna hasta volver a la posicin inicial. Deje que los msculos se relajen completamente antes de hacer otra repeticin. Repita __________ veces. Realice este ejercicio __________ veces al da.   Elevaciones de pierna, en decbito prono Este ejercicio  fortalece los msculos que [employer] caderas hacia atrs (extensores de la cadera). 1. Recustese boca abajo (posicin prona) en la cama o sobre una superficie firme. Puede colocar un almohadn debajo de la cadera si le resulta ms cmodo para la parte baja de la espalda. 2. Flexione la rodilla izquierda/derecha de modo que el pie quede en el aire. 3. Contraiga los msculos de las nalgas y eleve el muslo izquierdo/derecho hasta separarlo de la cama. No deje que la espalda se arquee. 4. Tense el msculo del muslo tanto como pueda sin aumentar el dolor en la rodilla. 5. Mantenga esta posicin durante __________ segundos. 6. Baje lentamente la pierna hasta volver a la posicin inicial y reljela por completo. Repita __________ veces. Realice este ejercicio __________ veces al da. Elevacin de cadera 1. Prese de Gaffer. Prese sobre la pierna izquierda/derecha con el otro pie sin apoyo junto al escaln. Sostngase de una baranda o de la pared para mantener el equilibrio si es necesario. 2. Mantenga las rodillas estiradas y el torso  derecho. Luego eleve la cadera izquierda/derecha hacia el techo. 3. Lentamente baje la cadera izquierda/derecha en direccin al suelo, ms abajo de la posicin inicial. El pie debe acercarse ms al suelo. No incline ni flexione las rodillas. Repita __________ veces. Realice este ejercicio __________ veces al da. Esta informacin no tiene Theme park manager el consejo del mdico. Asegrese de hacerle al mdico cualquier pregunta que tenga. Document Revised: 09/26/2019 Document Reviewed: 09/26/2019 Elsevier Patient Education  2021 Elsevier Inc.   Esguince del ligamento lateral interno de la rodilla Medial Collateral Knee Ligament Sprain  El ligamento lateral interno (LLI) es una banda resistente de tejido en la rodilla. Conecta la parte superior del hueso de la canilla (tibia) a la parte inferior del hueso del muslo (fmur). El LLI evita que la  rodilla se mueva demasiado hacia adentro y la mantiene estable. Un esguince de LLI es una distensin o un desgarro del LLI. Cules son las causas? Esta afeccin puede ser causada por lo siguiente:  Un golpe fuerte y directo en la parte externa de la rodilla. Esta es una causa frecuente.  Que la rodilla se doble hacia adentro al correr, Probation officer, girar o cambiar sbitamente de direccin.  Estirar el ligamento demasiado en reiteradas ocasiones. Qu incrementa el riesgo? Los siguientes factores pueden hacer que usted sea ms propenso a sufrir esta afeccin:  Microbiologist de contacto, como lucha libre o ftbol americano.  Practicar deportes que implican movimientos bruscos de cortes, torceduras o giros. Estos movimientos son frecuentes en el hockey, el esqu y el ftbol.  Tener debilidad en la cadera y en los msculos del torso. Cules son los signos o sntomas? Los sntomas de esta afeccin incluyen:  Una sensacin como de un estallido o un chasquido en la rodilla en el momento de la lesin.  Or un ruido, similar a un estallido o un chasquido, en el momento de la lesin.  Dolor en la parte interna de la rodilla.  Hinchazn en la rodilla.  Hematoma alrededor de la rodilla.  Dolor a la palpacin al costado interno de la rodilla.  Inestabilidad al pararse, como si la rodilla fuera a doblarse.  Dificultad al caminar por superficies irregulares. Cmo se diagnostica? Esta afeccin se puede diagnosticar en funcin de lo siguiente:  Sus antecedentes mdicos.  Un examen fsico.  Estudios de diagnstico por imgenes, como una radiografa, una ecografa o una resonancia magntica (RM). Durante el examen fsico, el mdico lo examinar para verificar si le duele, y si ha perdido el movimiento y la estabilidad. Cmo se trata? El tratamiento de esta afeccin depende de la gravedad de la lesin. El tratamiento puede incluir:  Pharmacologist la rodilla sin peso hasta que la hinchazn y  Chief Technology Officer se [place].  Levantar (elevar) la rodilla por encima del nivel del corazn. Esto ayuda a Building services engineer.  Aplicar hielo sobre la rodilla. Esto ayuda a Building services engineer.  Tomar antiinflamatorios no esteroideos (AINE), como el ibuprofeno. Esto ayuda a Engineer, materials y la hinchazn.  Usar un dispositivo ortopdico, rodillera o Engineer, manufacturing systems la lesin sana.  Usar Neomia Dear rodillera al realizar actividades deportivas.  Realizar ejercicios de rehabilitacin (fisioterapia).  Ciruga. Esta puede ser necesaria en los siguientes casos: ? El ligamento se desgarra por completo. ? La rodilla est inestable. ? La rodilla no mejora con otros tratamientos. Siga estas instrucciones en su casa: Si tiene un dispositivo ortopdico o una rodillera:  selos como se lo haya indicado el mdico. Quteselo solamente como se lo  haya indicado el mdico.  Controle todos los [employer] piel alrededor de la rodillera o el dispositivo ortopdico. Informe al mdico acerca de cualquier inquietud.  Afloje el dispositivo ortopdico o qutese la rodillera si los dedos de los pies se le entumecen, si siente hormigueos, o si se le enfran y se tornan de Research officer, trade union.  Mantngalos limpios.  Si la rodillera o el dispositivo ortopdico no son impermeables: ? No deje que se mojen. ? Cbralos con un envoltorio hermtico cuando tome un bao de inmersin o una ducha. Control del dolor, la rigidez y la hinchazn  Si se lo indican, aplique hielo sobre la zona interna de la rodilla. Para hacer esto: ? Si [place] un dispositivo ortopdico o rodillera desmontable, quteselos segn las indicaciones del mdico. ? Ponga el hielo en una bolsa plstica. ? Coloque una [employer] piel y Copy. ? Aplique el hielo durante , 2 o 3veces por da.  Mueva el pie y los dedos del pie con frecuencia para reducir la rigidez y la hinchazn.  Cuando est sentado o acostado, levante (eleve) la zona de la lesin por  encima del nivel del corazn.   Actividad  Pregntele al mdico cundo puede volver a conducir si tiene un dispositivo ortopdico o una rodillera en la pierna.  Haga ejercicio como se lo haya indicado el mdico.  No use la pierna lesionada para sostener el peso del cuerpo hasta que el mdico lo autorice. Use las muletas como se lo haya indicado el mdico.  Retome sus actividades normales segn lo indicado por el mdico. Pregntele al mdico qu actividades son seguras para usted. Instrucciones generales  Use los medicamentos de venta libre y los recetados solamente como se lo haya indicado el mdico.  No consuma ningn producto que contenga nicotina o tabaco. Estos productos incluyen cigarrillos, tabaco para Theatre manager y aparatos de vapeo, como los Administrator, Civil Service. Si necesita ayuda para dejar de fumar, consulte al mdico.  Cumpla con todas las visitas de seguimiento. Esto es importante. Cmo se evita?  Precaliente y elongue adecuadamente antes de hacer actividad fsica.  Reljese y elongue despus de hacer actividad fsica.  Dele al cuerpo tiempo para [employer] perodos de [place] fsica.  Asegrese de usar calzado y otro equipo que sean aptos para usted.  Tome medidas de seguridad y sea responsable al hacer actividad fsica. Esto ayudar a evitar cadas.  Haga por lo menos de ejercicios de intensidad moderada cada semana, como caminar a paso ligero o hacer gimnasia acutica.  Mantenga un buen estado fsico, que incluye lo siguiente: ? [place]. ? Flexibilidad. ? Buena capacidad del corazn (cardiovascular). ? Resistencia. Comunquese con un mdico si:  Sus sntomas no mejoran.  Los sntomas empeoran. Resumen  Un esquince del LLI es una lesin en la rodilla provocada como consecuencia del estiramiento excesivo de este ligamento. La lesin puede implicar un desgarro del ligamento.  El tratamiento de esta afeccin depende de la gravedad de la  lesin. Puede incluir reposo, usar un dispositivo ortopdico o [place].  No use la pierna lesionada para sostener el peso del cuerpo hasta que el mdico lo autorice. Use las muletas como se lo haya indicado el mdico.  Comunquese con el mdico si los sntomas no mejoran o si empeoran.  Cumpla con todas las visitas de seguimiento. Esto es importante. Esta informacin no tiene Theme park manager el consejo del mdico. Asegrese de hacerle al mdico cualquier pregunta que tenga. Document Revised: 04/06/2020 Document Reviewed: 04/06/2020  Elsevier Patient Education  2021 Elsevier Inc.  

## 2020-08-20 NOTE — Progress Notes (Signed)
Chief Complaint  Patient presents with  . Knee Pain    Knee pain, left started Friday. The pain actually started Friday with his right knee but he began to use his left knee more and now his left knee is hurting more. Using ice and Aspercreme. Left work early today due pain (needs note).     Pain started on the R knee on Friday, while walking.  He stepped differently to avoid stepping on his dog, and may have tweaked his knee (to avoid falling).   He started putting more pressure on the left, due to the pain on the R knee, and then the left knee also started hurting.  Had pain in both knees by the end of the walk (1 hour walk). L knee appeared a little swollen, he iced the left knee, and used aspercreme.  Took no pain medications by mouth. Pain persisted through the weekend, slightly worse.  Felt like the left knee was more swollen on Sunday. He continued to walk during the weekend, but slower. He was walking in [employer], which are old (bottoms are worn, wearing them today).  Stands all day at [facility] steel-toed boots.  He had to leave work today due to pain in the L>R knee.  No prior pain in his knees, no other h/o injury or knee problems.   PMH, PSH, SH reviewed--HIV, HTN   Outpatient Encounter Medications as of 08/20/2020  Medication Sig  . BIKTARVY 50-200-25 MG TABS tablet TAKE 1 TABLET BY MOUTH DAILY.  Marland Kitchen losartan (COZAAR) 50 MG tablet Take 50 mg by mouth daily.  . Melatonin 10 MG CAPS Take by mouth as needed (sleep).  . traZODone (DESYREL) 50 MG tablet TAKE 1 TABLET BY MOUTH EVERY EVENING AT BEDTIME (ADDITION REFILLS AT VISIT)  . trolamine salicylate (ASPERCREME) 10 % cream Apply 1 application topically as needed for muscle pain.  . naproxen (NAPROSYN) 125 MG/5ML suspension Take by mouth as needed (pain). (Patient not taking: No sig reported)  . naproxen sodium (ALEVE) 220 MG tablet Take 220 mg by mouth as needed (head palpitations). (Patient not taking: No sig reported)    No facility-administered encounter medications on file as of 08/20/2020.   No Known Allergies  ROS: no fever, chills, URI symptoms. No n/v/d, abdominal pain, no bleeding, bruising, rashes,  No headache, chest pain.   PHYSICAL EXAM:  BP 130/80   Pulse 64   Ht 5\' 7"  (1.702 m)   Wt 215 lb (97.5 kg)   BMI 33.67 kg/m   Well-appearing, pleasant, overweight male.  Has moderate discomfort with walking/position changes, mainly related to his left knee. HEENT: conjunctiva and sclera are clear, EOMI. Wearing mask  Knees: R knee--FROM, no crepitus.  No joint line tenderness. Normal exam. L knee--no significant effusion, no warmth. He is tender at the medial joint line and medial knee, and has pain with valgus stretch.  He is also tender laterally at the left knee, and pain with IT band stretch--pain is at left lateral hip by the TFL muscle, and laterally along the left knee--starting superior to the knee, extending inferiorly.  No swelling noted. No crepitus at the left knee.  ASSESSMENT/PLAN:  Strain of left knee, initial encounter - strain of Left MCL, and IT band.  Shown IT band stretches. To wear knee brace.  Risks/SE of NSAIDs reviewed. f/u 1-2 wks if not resolving - Plan: naproxen (NAPROSYN) 500 MG tablet   MCL strain IT band strain  Instructed on icing, elevation, activity, proper  shoe-wear. Risks/SE of meds reviewed (and potential increased risk on kidneys with his HIV meds). Lab Results  Component Value Date   CREATININE 1.20 07/24/2020   Pt to stay well hydrated, and stop NSAIDs when symptoms resolve. To f/u with Dr. Susann Givens if knee pain persists worsens.  To let us know if he develops any locking or giving way.   I spent 35 minutes dedicated to the care of this patient, including pre-visit review of records, face to face time, post-visit ordering of testing and documentation.

## 2020-08-22 NOTE — Progress Notes (Signed)
Chief Complaint  Patient presents with  . Knee Pain    Knee pain (L) much worse. Now using (R) knee again and that pain is also worsening. Used ice, did exercises and took naproxen but has gotten no relief.     Patient presents for follow up on knee pain. He was seen 3/7 with complaints of bilateral knee pain--had started on the right, but then developed on L, which had been the most painful. He was diagnosed with MCL strain and IT band strain on the left. He was shown stretches, prescribed naproxen 500mg  BID, and was to wear brace at work.  He stayed out of work Tuesday, worked all day yesterday, wearing brace . He reports the left knee was very painful and very swollen at the end of the day. He is now having worsening pain at the L knee.  Pain is at the kneecap/front of the knee.  No longer having pain at the lateral or medial knee. It hurts a lot to walk, and with weight-bearing on the left knee   PMH, PSH, SH reviewed  Due for pfizer booster today (HIV)  Outpatient Encounter Medications as of 08/23/2020  Medication Sig  . BIKTARVY 50-200-25 MG TABS tablet TAKE 1 TABLET BY MOUTH DAILY.  10/23/2020 losartan-hydrochlorothiazide (HYZAAR) 50-12.5 MG tablet Take 1 tablet by mouth daily.  . Melatonin 10 MG CAPS Take by mouth as needed (sleep).  . naproxen (NAPROSYN) 500 MG tablet Take 1 tablet (500 mg total) by mouth 2 (two) times daily with a meal. Take until pain resolves  . traZODone (DESYREL) 50 MG tablet TAKE 1 TABLET BY MOUTH EVERY EVENING AT BEDTIME (ADDITION REFILLS AT VISIT)  . trolamine salicylate (ASPERCREME) 10 % cream Apply 1 application topically as needed for muscle pain.  . [DISCONTINUED] losartan (COZAAR) 50 MG tablet Take 50 mg by mouth daily.  . [DISCONTINUED] naproxen sodium (ALEVE) 220 MG tablet Take 220 mg by mouth as needed (head palpitations). (Patient not taking: No sig reported)   No facility-administered encounter medications on file as of 08/23/2020.   No Known  Allergies  ROS: no fever, chills, URI symptoms, GI complaints.  L knee pain per HPI. No bruising, bleeding.   PHYSICAL EXAM:  BP 138/88   Pulse 72   Ht 5\' 7"  (1.702 m)   Wt 216 lb (98 kg)   BMI 33.83 kg/m   Well-appearing male, in obvious discomfort with weight-bearing, walking. On the left knee, there is a significant indentation from brace--wearing too low, indentation and opening below the patella No longer tender at medial knee or lateral knee on L Some crepitus, FROM Mild tenderness at the patella and patellar tendon No effusion or warmth.   ASSESSMENT/PLAN:  Acute pain of left knee - MCL and ITB no longer tender.  Now pain at patella and tendon--suspect related to wearing of brace. d/c brace, cont naproxen. Rest x2d, RTW Mon  Need for COVID-19 vaccine - Plan: PFIZER Comirnaty(GRAY TOP)COVID-19 Vaccine, CANCELED: Pfizer SARS-COV-2 Vaccine    Stop wearing the brace. Continue the naproxen twice daily with food. The pain at the IT band and inner part of the knee is much better. Your pain now is at the knee cap and the patellar tendon.  I suspect that the brace (being worn a little bit too low) may contributing to your discomfort.  Ice and elevate the knee when painful and swollen. Stay off the leg today and tomorrow. We are giving you a note to be out of work (  return Monday).

## 2020-08-23 ENCOUNTER — Ambulatory Visit: Payer: 59 | Admitting: Family Medicine

## 2020-08-23 ENCOUNTER — Ambulatory Visit: Payer: 59

## 2020-08-23 ENCOUNTER — Encounter: Payer: Self-pay | Admitting: Family Medicine

## 2020-08-23 ENCOUNTER — Other Ambulatory Visit: Payer: Self-pay

## 2020-08-23 ENCOUNTER — Ambulatory Visit: Payer: 59 | Admitting: Internal Medicine

## 2020-08-23 VITALS — BP 138/88 | HR 72 | Ht 67.0 in | Wt 216.0 lb

## 2020-08-23 DIAGNOSIS — Z23 Encounter for immunization: Secondary | ICD-10-CM | POA: Diagnosis not present

## 2020-08-23 DIAGNOSIS — M25562 Pain in left knee: Secondary | ICD-10-CM

## 2020-08-23 NOTE — Patient Instructions (Signed)
Stop wearing the brace. Continue the naproxen twice daily with food. The pain at the IT band and inner part of the knee is much better. Your pain now is at the knee cap and the patellar tendon.  I suspect that the brace (being worn a little bit too low) may contributing to your discomfort.  Ice and elevate the knee when painful and swollen. Stay off the leg today and tomorrow. We are giving you a note to be out of work (return Monday).

## 2020-09-01 ENCOUNTER — Other Ambulatory Visit: Payer: Self-pay | Admitting: Family Medicine

## 2020-09-01 DIAGNOSIS — S86912A Strain of unspecified muscle(s) and tendon(s) at lower leg level, left leg, initial encounter: Secondary | ICD-10-CM

## 2020-09-03 ENCOUNTER — Other Ambulatory Visit: Payer: Self-pay | Admitting: Infectious Disease

## 2020-09-03 DIAGNOSIS — B20 Human immunodeficiency virus [HIV] disease: Secondary | ICD-10-CM

## 2020-09-03 DIAGNOSIS — G4700 Insomnia, unspecified: Secondary | ICD-10-CM

## 2020-09-03 NOTE — Telephone Encounter (Signed)
Is this okay to refill? 

## 2020-09-03 NOTE — Telephone Encounter (Signed)
Does he actually need this or is it auto-refill?  If ongoing problems with his knee, he should see Dr. Susann Givens in f/u

## 2020-09-03 NOTE — Telephone Encounter (Signed)
Left message asking patient if he put in this request or was it an auto-refill. Also let him know he was having continuing issues with he knee he should schedule follow up with J[facility].

## 2020-09-04 NOTE — Telephone Encounter (Signed)
Pts husband called back and said pt did not need the refill. He is feeling better now

## 2020-09-07 ENCOUNTER — Other Ambulatory Visit (HOSPITAL_COMMUNITY): Payer: Self-pay

## 2020-09-12 ENCOUNTER — Encounter: Payer: Self-pay | Admitting: Infectious Disease

## 2020-09-12 ENCOUNTER — Ambulatory Visit (INDEPENDENT_AMBULATORY_CARE_PROVIDER_SITE_OTHER): Payer: 59 | Admitting: Infectious Disease

## 2020-09-12 ENCOUNTER — Other Ambulatory Visit: Payer: Self-pay | Admitting: Infectious Disease

## 2020-09-12 ENCOUNTER — Other Ambulatory Visit: Payer: Self-pay

## 2020-09-12 VITALS — BP 120/81 | HR 76 | Temp 98.3°F | Ht 67.0 in | Wt 211.0 lb

## 2020-09-12 DIAGNOSIS — B20 Human immunodeficiency virus [HIV] disease: Secondary | ICD-10-CM

## 2020-09-12 DIAGNOSIS — R55 Syncope and collapse: Secondary | ICD-10-CM

## 2020-09-12 DIAGNOSIS — R85610 Atypical squamous cells of undetermined significance on cytologic smear of anus (ASC-US): Secondary | ICD-10-CM | POA: Diagnosis not present

## 2020-09-12 DIAGNOSIS — K6282 Dysplasia of anus: Secondary | ICD-10-CM | POA: Diagnosis not present

## 2020-09-12 MED ORDER — BIKTARVY 50-200-25 MG PO TABS: 1.0000 | ORAL_TABLET | Freq: Every day | ORAL | 11 refills | Status: DC
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
## 2020-09-25 ENCOUNTER — Other Ambulatory Visit (HOSPITAL_COMMUNITY): Payer: Self-pay

## 2020-09-25 ENCOUNTER — Other Ambulatory Visit: Payer: Self-pay | Admitting: Infectious Disease

## 2020-09-25 DIAGNOSIS — G4700 Insomnia, unspecified: Secondary | ICD-10-CM

## 2020-09-25 MED ORDER — TRAZODONE HCL 50 MG PO TABS
ORAL_TABLET | ORAL | 3 refills | Status: DC
  Filled 2020-10-03: qty 30, 30d supply, fill #0
  Filled 2020-11-05: qty 30, 30d supply, fill #1
  Filled 2020-12-04: qty 30, 30d supply, fill #2
  Filled 2021-01-07: qty 30, 30d supply, fill #3

## 2020-09-25 MED FILL — Bictegravir-Emtricitabine-Tenofovir AF Tab 50-200-25 MG: ORAL | 30 days supply | Qty: 30 | Fill #0 | Status: CN

## 2020-09-25 MED FILL — Bictegravir-Emtricitabine-Tenofovir AF Tab 50-200-25 MG: ORAL | 30 days supply | Qty: 30 | Fill #0 | Status: AC

## 2020-10-03 ENCOUNTER — Other Ambulatory Visit (HOSPITAL_COMMUNITY): Payer: Self-pay

## 2020-11-02 ENCOUNTER — Other Ambulatory Visit (HOSPITAL_COMMUNITY): Payer: Self-pay

## 2020-11-05 ENCOUNTER — Other Ambulatory Visit (HOSPITAL_COMMUNITY): Payer: Self-pay

## 2020-11-05 MED FILL — Bictegravir-Emtricitabine-Tenofovir AF Tab 50-200-25 MG: ORAL | 30 days supply | Qty: 30 | Fill #1 | Status: AC

## 2020-11-14 ENCOUNTER — Other Ambulatory Visit (HOSPITAL_COMMUNITY): Payer: Self-pay

## 2020-11-30 ENCOUNTER — Other Ambulatory Visit (HOSPITAL_COMMUNITY): Payer: Self-pay

## 2020-12-04 ENCOUNTER — Other Ambulatory Visit (HOSPITAL_COMMUNITY): Payer: Self-pay

## 2020-12-04 MED FILL — Bictegravir-Emtricitabine-Tenofovir AF Tab 50-200-25 MG: ORAL | 30 days supply | Qty: 30 | Fill #2 | Status: AC

## 2020-12-31 ENCOUNTER — Other Ambulatory Visit (HOSPITAL_COMMUNITY): Payer: Self-pay

## 2021-01-01 ENCOUNTER — Other Ambulatory Visit (HOSPITAL_COMMUNITY): Payer: Self-pay

## 2021-01-07 ENCOUNTER — Other Ambulatory Visit (HOSPITAL_COMMUNITY): Payer: Self-pay

## 2021-01-07 MED FILL — Bictegravir-Emtricitabine-Tenofovir AF Tab 50-200-25 MG: ORAL | 30 days supply | Qty: 30 | Fill #3 | Status: AC

## 2021-01-14 ENCOUNTER — Other Ambulatory Visit (HOSPITAL_COMMUNITY): Payer: Self-pay

## 2021-01-28 ENCOUNTER — Other Ambulatory Visit (HOSPITAL_COMMUNITY): Payer: Self-pay

## 2021-01-29 ENCOUNTER — Other Ambulatory Visit (HOSPITAL_COMMUNITY): Payer: Self-pay

## 2021-01-29 ENCOUNTER — Other Ambulatory Visit: Payer: Self-pay | Admitting: Infectious Disease

## 2021-01-29 DIAGNOSIS — G4700 Insomnia, unspecified: Secondary | ICD-10-CM

## 2021-01-29 MED ORDER — TRAZODONE HCL 50 MG PO TABS
ORAL_TABLET | ORAL | 3 refills | Status: DC
  Filled 2021-01-29: qty 30, 30d supply, fill #0
  Filled 2021-02-27: qty 30, 30d supply, fill #1
  Filled 2021-04-10: qty 30, 30d supply, fill #2
  Filled 2021-05-08: qty 30, 30d supply, fill #3

## 2021-01-29 MED FILL — Bictegravir-Emtricitabine-Tenofovir AF Tab 50-200-25 MG: ORAL | 30 days supply | Qty: 30 | Fill #4 | Status: AC

## 2021-01-29 NOTE — Telephone Encounter (Signed)
Please advise on refill.

## 2021-02-25 ENCOUNTER — Other Ambulatory Visit (HOSPITAL_COMMUNITY): Payer: Self-pay

## 2021-02-27 ENCOUNTER — Other Ambulatory Visit (HOSPITAL_COMMUNITY): Payer: Self-pay

## 2021-02-27 MED FILL — Bictegravir-Emtricitabine-Tenofovir AF Tab 50-200-25 MG: ORAL | 30 days supply | Qty: 30 | Fill #5 | Status: AC

## 2021-03-06 ENCOUNTER — Other Ambulatory Visit (HOSPITAL_COMMUNITY): Payer: Self-pay

## 2021-03-20 ENCOUNTER — Ambulatory Visit: Payer: 59 | Admitting: Family Medicine

## 2021-03-20 ENCOUNTER — Ambulatory Visit
Admission: EM | Admit: 2021-03-20 | Discharge: 2021-03-20 | Disposition: A | Discharge status: Home / Self Care | Payer: 59 | Attending: Urgent Care | Admitting: Urgent Care

## 2021-03-20 ENCOUNTER — Other Ambulatory Visit: Payer: Self-pay

## 2021-03-20 ENCOUNTER — Encounter: Payer: Self-pay | Admitting: *Deleted

## 2021-03-20 DIAGNOSIS — R6889 Other general symptoms and signs: Secondary | ICD-10-CM | POA: Diagnosis not present

## 2021-03-20 DIAGNOSIS — H5789 Other specified disorders of eye and adnexa: Secondary | ICD-10-CM

## 2021-03-20 MED ORDER — AZELASTINE HCL 0.05 % OP SOLN: 1.0000 [drp] | Freq: Two times a day (BID) | OPHTHALMIC | 0 refills | Status: DC
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
## 2021-03-20 NOTE — ED Triage Notes (Signed)
Pt c/o something lodged in left eye onset last Monday. Described as 4/10 pain. States went to PCP who sent pt to eye provider who prescribed abx but states they could not see anything. Pt states he feels a contact in the eye and wants to be evaluated.

## 2021-04-02 ENCOUNTER — Other Ambulatory Visit (HOSPITAL_COMMUNITY): Payer: Self-pay

## 2021-04-10 ENCOUNTER — Other Ambulatory Visit (HOSPITAL_COMMUNITY): Payer: Self-pay

## 2021-04-10 MED FILL — Bictegravir-Emtricitabine-Tenofovir AF Tab 50-200-25 MG: ORAL | 30 days supply | Qty: 30 | Fill #6 | Status: AC

## 2021-05-08 ENCOUNTER — Other Ambulatory Visit (HOSPITAL_COMMUNITY): Payer: Self-pay

## 2021-05-08 MED FILL — Bictegravir-Emtricitabine-Tenofovir AF Tab 50-200-25 MG: ORAL | 30 days supply | Qty: 30 | Fill #7 | Status: AC

## 2021-05-13 ENCOUNTER — Other Ambulatory Visit (HOSPITAL_COMMUNITY): Payer: Self-pay

## 2021-06-05 ENCOUNTER — Other Ambulatory Visit (HOSPITAL_COMMUNITY): Payer: Self-pay

## 2021-06-07 ENCOUNTER — Other Ambulatory Visit (HOSPITAL_COMMUNITY): Payer: Self-pay

## 2021-06-07 ENCOUNTER — Other Ambulatory Visit: Payer: Self-pay | Admitting: Infectious Disease

## 2021-06-07 DIAGNOSIS — G4700 Insomnia, unspecified: Secondary | ICD-10-CM

## 2021-06-07 MED FILL — Bictegravir-Emtricitabine-Tenofovir AF Tab 50-200-25 MG: ORAL | 30 days supply | Qty: 30 | Fill #8 | Status: AC

## 2021-06-11 ENCOUNTER — Other Ambulatory Visit (HOSPITAL_COMMUNITY): Payer: Self-pay

## 2021-06-11 NOTE — Telephone Encounter (Signed)
Called patient with [employer] to assess need for trazodone, no answer.   Sandie Ano, RN

## 2021-06-11 NOTE — Telephone Encounter (Signed)
Okay to refill or do you want PCP to take over? Thanks °

## 2021-06-12 ENCOUNTER — Other Ambulatory Visit (HOSPITAL_COMMUNITY): Payer: Self-pay

## 2021-06-12 ENCOUNTER — Other Ambulatory Visit: Payer: Self-pay | Admitting: Infectious Disease

## 2021-06-12 DIAGNOSIS — G4700 Insomnia, unspecified: Secondary | ICD-10-CM

## 2021-06-13 ENCOUNTER — Ambulatory Visit (INDEPENDENT_AMBULATORY_CARE_PROVIDER_SITE_OTHER): Payer: 59

## 2021-06-13 ENCOUNTER — Other Ambulatory Visit: Payer: Self-pay

## 2021-06-13 ENCOUNTER — Other Ambulatory Visit (HOSPITAL_COMMUNITY): Payer: Self-pay

## 2021-06-13 ENCOUNTER — Encounter: Payer: Self-pay | Admitting: Family Medicine

## 2021-06-13 DIAGNOSIS — Z23 Encounter for immunization: Secondary | ICD-10-CM | POA: Diagnosis not present

## 2021-06-17 ENCOUNTER — Other Ambulatory Visit (HOSPITAL_COMMUNITY): Payer: Self-pay

## 2021-06-17 ENCOUNTER — Other Ambulatory Visit: Payer: Self-pay | Admitting: Infectious Disease

## 2021-06-17 ENCOUNTER — Other Ambulatory Visit: Payer: Self-pay | Admitting: Family Medicine

## 2021-06-17 DIAGNOSIS — G4700 Insomnia, unspecified: Secondary | ICD-10-CM

## 2021-06-17 NOTE — Telephone Encounter (Signed)
Defer refill to PCP

## 2021-06-18 ENCOUNTER — Other Ambulatory Visit (HOSPITAL_COMMUNITY): Payer: Self-pay

## 2021-06-18 MED ORDER — TRAZODONE HCL 50 MG PO TABS
ORAL_TABLET | ORAL | 3 refills | Status: DC
  Filled 2021-06-18: qty 30, 30d supply, fill #0
  Filled 2021-07-02: qty 30, 30d supply, fill #1
  Filled 2021-08-02: qty 30, 30d supply, fill #2
  Filled 2021-08-27: qty 30, 30d supply, fill #3

## 2021-07-02 ENCOUNTER — Other Ambulatory Visit (HOSPITAL_COMMUNITY): Payer: Self-pay

## 2021-07-02 MED FILL — Bictegravir-Emtricitabine-Tenofovir AF Tab 50-200-25 MG: ORAL | 30 days supply | Qty: 30 | Fill #9 | Status: AC

## 2021-07-10 ENCOUNTER — Other Ambulatory Visit (HOSPITAL_COMMUNITY): Payer: Self-pay

## 2021-08-02 ENCOUNTER — Other Ambulatory Visit (HOSPITAL_COMMUNITY): Payer: Self-pay

## 2021-08-02 MED FILL — Bictegravir-Emtricitabine-Tenofovir AF Tab 50-200-25 MG: ORAL | 30 days supply | Qty: 30 | Fill #10 | Status: AC

## 2021-08-06 ENCOUNTER — Other Ambulatory Visit (HOSPITAL_COMMUNITY): Payer: Self-pay

## 2021-08-15 ENCOUNTER — Other Ambulatory Visit: Payer: Self-pay

## 2021-08-15 DIAGNOSIS — Z79899 Other long term (current) drug therapy: Secondary | ICD-10-CM

## 2021-08-15 DIAGNOSIS — B20 Human immunodeficiency virus [HIV] disease: Secondary | ICD-10-CM

## 2021-08-15 DIAGNOSIS — Z113 Encounter for screening for infections with a predominantly sexual mode of transmission: Secondary | ICD-10-CM

## 2021-08-22 ENCOUNTER — Other Ambulatory Visit: Payer: Self-pay

## 2021-08-22 ENCOUNTER — Other Ambulatory Visit: Payer: 59

## 2021-08-22 DIAGNOSIS — Z113 Encounter for screening for infections with a predominantly sexual mode of transmission: Secondary | ICD-10-CM

## 2021-08-22 DIAGNOSIS — B20 Human immunodeficiency virus [HIV] disease: Secondary | ICD-10-CM

## 2021-08-22 DIAGNOSIS — Z79899 Other long term (current) drug therapy: Secondary | ICD-10-CM

## 2021-08-25 LAB — LIPID PANEL
Cholesterol: 151 mg/dL (ref <200)
HDL: 44 mg/dL (ref >=40)
LDL Cholesterol (Calc): 88 mg/dL (calc)
Non-HDL Cholesterol (Calc): 107 mg/dL (calc) (ref <130)
Total CHOL/HDL Ratio: 3.4 (calc) (ref <5.0)
Triglycerides: 95 mg/dL (ref <150)

## 2021-08-25 LAB — CBC WITH DIFFERENTIAL/PLATELET
Absolute Monocytes: 450 cells/uL (ref 200–950)
Basophils Absolute: 50 cells/uL (ref 0–200)
Basophils Relative: 1.1 %
Eosinophils Absolute: 212 cells/uL (ref 15–500)
Eosinophils Relative: 4.7 %
HCT: 49.7 % (ref 38.5–50.0)
Hemoglobin: 17 g/dL (ref 13.2–17.1)
Lymphs Abs: 1251 cells/uL (ref 850–3900)
MCH: 30.6 pg (ref 27.0–33.0)
MCHC: 34.2 g/dL (ref 32.0–36.0)
MCV: 89.4 fL (ref 80.0–100.0)
MPV: 10.1 fL (ref 7.5–12.5)
Monocytes Relative: 10 %
Neutro Abs: 2538 cells/uL (ref 1500–7800)
Neutrophils Relative %: 56.4 %
Platelets: 245 10*3/uL (ref 140–400)
RBC: 5.56 10*6/uL (ref 4.20–5.80)
RDW: 12.6 % (ref 11.0–15.0)
Total Lymphocyte: 27.8 %
WBC: 4.5 10*3/uL (ref 3.8–10.8)

## 2021-08-25 LAB — HIV-1 RNA QUANT-NO REFLEX-BLD
HIV 1 RNA Quant: <20 Copies/mL — ABNORMAL HIGH
HIV-1 RNA Quant, Log: <1.3 Log cps/mL — ABNORMAL HIGH

## 2021-08-25 LAB — COMPLETE METABOLIC PANEL WITH GFR
AG Ratio: 1.8 (calc) (ref 1.0–2.5)
ALT: 17 U/L (ref 9–46)
AST: 15 U/L (ref 10–35)
Albumin: 4.5 g/dL (ref 3.6–5.1)
Alkaline phosphatase (APISO): 61 U/L (ref 35–144)
BUN: 16 mg/dL (ref 7–25)
CO2: 27 mmol/L (ref 20–32)
Calcium: 9.8 mg/dL (ref 8.6–10.3)
Chloride: 107 mmol/L (ref 98–110)
Creat: 1.1 mg/dL (ref 0.70–1.30)
Globulin: 2.5 g/dL (calc) (ref 1.9–3.7)
Glucose, Bld: 84 mg/dL (ref 65–99)
Potassium: 4.8 mmol/L (ref 3.5–5.3)
Sodium: 143 mmol/L (ref 135–146)
Total Bilirubin: 0.5 mg/dL (ref 0.2–1.2)
Total Protein: 7 g/dL (ref 6.1–8.1)
eGFR: 79 mL/min/{1.73_m2} (ref >=60)

## 2021-08-25 LAB — T-HELPER CELLS (CD4) COUNT (NOT AT ARMC)
Absolute CD4: 453 cells/uL — ABNORMAL LOW (ref 490–1740)
CD4 T Helper %: 35 % (ref 30–61)
Total lymphocyte count: 1295 cells/uL (ref 850–3900)

## 2021-08-25 LAB — C. TRACHOMATIS/N. GONORRHOEAE RNA
C. trachomatis RNA, TMA: NOT DETECTED
N. gonorrhoeae RNA, TMA: NOT DETECTED

## 2021-08-25 LAB — RPR: RPR Ser Ql: NONREACTIVE

## 2021-08-27 ENCOUNTER — Encounter: Payer: Self-pay | Admitting: Family Medicine

## 2021-08-27 ENCOUNTER — Other Ambulatory Visit (HOSPITAL_COMMUNITY): Payer: Self-pay

## 2021-08-27 ENCOUNTER — Other Ambulatory Visit: Payer: Self-pay

## 2021-08-27 ENCOUNTER — Ambulatory Visit: Payer: 59 | Admitting: Family Medicine

## 2021-08-27 VITALS — BP 128/82 | HR 72 | Temp 97.1°F | Wt 204.0 lb

## 2021-08-27 DIAGNOSIS — H029 Unspecified disorder of eyelid: Secondary | ICD-10-CM

## 2021-08-27 DIAGNOSIS — Z86010 Personal history of colonic polyps: Secondary | ICD-10-CM

## 2021-08-27 MED FILL — Bictegravir-Emtricitabine-Tenofovir AF Tab 50-200-25 MG: ORAL | 30 days supply | Qty: 30 | Fill #11 | Status: AC

## 2021-08-27 NOTE — Progress Notes (Signed)
? ?  Subjective:  ? ? Patient ID: Edward Gillespie, male    DOB: [birth year], 56 y.o.   MRN: [id number] ? ?HPI ?He is here for evaluation of 2 issues.  He does have a lesion on the right lateral lower eyelid that he would like further evaluated. ?He also apparently had a colonoscopy in 2016 in [place] and they found 3 polyps but he is unsure as to exactly what the cause were.  He will need follow-up on that. ? ? ?Review of Systems ? ?   ?Objective:  ? Physical Exam ?Exam of the right I lower lateral aspect does show a constellation of relatively benign appearing lesions.  Cornea and conjunctive appear normal. ? ? ? ?   ?Assessment & Plan:  ?History of colonic polyps ? ?Eyelid lesion - Plan: Ambulatory referral to Ophthalmology ?He is to find the name of the doctor in [facility] for [place].  We will send off for the path report to determine whether we can do a colonoscopy or possibly follow-up with Cologuard. ? ?

## 2021-09-04 ENCOUNTER — Other Ambulatory Visit (HOSPITAL_COMMUNITY): Payer: Self-pay

## 2021-09-09 ENCOUNTER — Ambulatory Visit (INDEPENDENT_AMBULATORY_CARE_PROVIDER_SITE_OTHER): Payer: 59 | Admitting: Infectious Disease

## 2021-09-09 ENCOUNTER — Other Ambulatory Visit (HOSPITAL_COMMUNITY)
Admission: RE | Admit: 2021-09-09 | Discharge: 2021-09-09 | Disposition: A | Discharge status: Home / Self Care | Payer: 59 | Source: Ambulatory Visit | Attending: Infectious Disease | Admitting: Infectious Disease

## 2021-09-09 ENCOUNTER — Other Ambulatory Visit: Payer: Self-pay

## 2021-09-09 ENCOUNTER — Other Ambulatory Visit (HOSPITAL_COMMUNITY): Payer: Self-pay

## 2021-09-09 ENCOUNTER — Encounter: Payer: Self-pay | Admitting: Infectious Disease

## 2021-09-09 VITALS — BP 134/89 | HR 87 | Temp 98.3°F | Ht 67.0 in | Wt 204.0 lb

## 2021-09-09 DIAGNOSIS — Z113 Encounter for screening for infections with a predominantly sexual mode of transmission: Secondary | ICD-10-CM | POA: Diagnosis not present

## 2021-09-09 DIAGNOSIS — B20 Human immunodeficiency virus [HIV] disease: Secondary | ICD-10-CM

## 2021-09-09 DIAGNOSIS — Z23 Encounter for immunization: Secondary | ICD-10-CM | POA: Diagnosis not present

## 2021-09-09 DIAGNOSIS — I493 Ventricular premature depolarization: Secondary | ICD-10-CM | POA: Diagnosis not present

## 2021-09-09 DIAGNOSIS — I10 Essential (primary) hypertension: Secondary | ICD-10-CM | POA: Diagnosis not present

## 2021-09-09 MED ORDER — BICTEGRAVIR-EMTRICITAB-TENOFOV 50-200-25 MG PO TABS
1.0000 | ORAL_TABLET | Freq: Every day | ORAL | 11 refills | Status: DC
  Filled 2021-09-09 – 2021-10-07 (×2): qty 30, 30d supply, fill #0
  Filled 2021-10-29: qty 30, 30d supply, fill #1
  Filled 2021-11-22: qty 30, 30d supply, fill #2
  Filled 2021-12-19: qty 30, 30d supply, fill #3
  Filled 2022-01-16: qty 30, 30d supply, fill #4

## 2021-09-09 MED ORDER — DOXYCYCLINE HYCLATE 100 MG PO TABS
ORAL_TABLET | ORAL | 6 refills | Status: DC
  Filled 2021-09-09: qty 60, 30d supply, fill #0
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
## 2021-09-10 LAB — CYTOLOGY, (ORAL, ANAL, URETHRAL) ANCILLARY ONLY
Chlamydia: NEGATIVE
Chlamydia: NEGATIVE
Comment: NEGATIVE
Comment: NEGATIVE
Comment: NORMAL
Comment: NORMAL
Neisseria Gonorrhea: NEGATIVE
Neisseria Gonorrhea: NEGATIVE

## 2021-09-18 ENCOUNTER — Encounter: Payer: Self-pay | Admitting: Family Medicine

## 2021-09-18 ENCOUNTER — Ambulatory Visit: Payer: 59 | Admitting: Family Medicine

## 2021-09-18 VITALS — BP 112/80 | HR 80 | Ht 67.0 in | Wt 203.8 lb

## 2021-09-18 DIAGNOSIS — M5442 Lumbago with sciatica, left side: Secondary | ICD-10-CM

## 2021-09-18 DIAGNOSIS — M5441 Lumbago with sciatica, right side: Secondary | ICD-10-CM | POA: Diagnosis not present

## 2021-09-18 MED ORDER — MELOXICAM 7.5 MG PO TABS: 7.5000 mg | ORAL_TABLET | Freq: Every day | ORAL | 0 refills | Status: DC

## 2021-09-18 NOTE — Progress Notes (Signed)
Chief Complaint  ?Patient presents with  ? Tailbone Pain  ?  LBP that started last week, he states it is L3,4 and 5 pinched nerve and sciatica. Could not give UA. Tried ice and heating pad as well as Tourist information centre manager. Is using back brace today. Used tylenol and ibuprofen not helping. Has used naproxen in the past and it has helped, but makes him sleepy.   ? ?Mobile translator was used, per pt request, "Veronica". ? ?Patient presents with 1 week complaint of LBP and bilateral sciatica.  He has had this in the past, last episode was about 6 months ago. ? 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 ?He reports pain started while at work. He wasn't lifting anything heavy at the time.  Pain is across the entire lower back, and goes down the back of both legs.  He describes it as a "Pinched nerve" down both of his legs ?Strong pain, not tingling or like it is asleep. ?No weakness. ?No bladder issue or incontinence ? ?When the pain is present, it is constant pain, but will go away and recur throughout the day. Unsure what makes it go away. ? ?He has tried Tylenol (1gm at a time), 800mg  ibuprofen once daily.  These didn't help much.  ?Tiger balm helped a little. ? ?In the past he has taken Naproxen 500mg , which he recalls caused sleepiness. He doesn't want to take anything that may make him drowsy. ? ?Has been doing home exercises that he got from the internet. He is also trying to lift properly (lifting with his legs, rather than at the waist). ? ? ?PMH, PSH, SH reviewed ?HIV, on Biktarvy ? ?Outpatient Encounter Medications as of 09/18/2021  ?Medication Sig  ? bictegravir-emtricitabine-tenofovir AF (BIKTARVY) 50-200-25 MG TABS tablet TAKE 1 TABLET BY MOUTH DAILY.  ? Melatonin 10 MG CAPS Take by mouth as needed (sleep).  ? meloxicam (MOBIC) 7.5 MG tablet Take 1 tablet (7.5 mg total) by mouth daily. Take with food (dinner). Take it daily until your pain has resolved  ? traZODone (DESYREL) 50 MG  tablet TAKE 1 TABLET BY MOUTH EVERY EVENING AT BEDTIME  ? doxycycline (VIBRA-TABS) 100 MG tablet Take 2 tablets by mouth after sex to prevent STI (Patient not taking: Reported on 09/18/2021)  ? ?No facility-administered encounter medications on file as of 09/18/2021.  ? ?NOT taking meloxicam prior to today's visit. ?Was taking ibuprofen 800mg  once daily, and tylenol 1000mg  daily. ? ?No Known Allergies ? ?ROS: no fever, chills, URI symptoms, urinary complaints, abdominal pain, weakness, bleeding, or other concerns, except as noted in HPI. ? ? ?PHYSICAL EXAM: ? ?BP 112/80   Pulse 80   Ht 5\' 7"  (1.702 m)   Wt 203 lb 12.8 oz (92.4 kg)   BMI 31.92 kg/m?  ? ?Well-appearing, pleasant male, in no acute distress. ?HEENT: conjunctiva and sclera are clear, EOMI, wearing mask ?Heart: regular rate and rhythm ?Lungs: clear bilaterally ?Abdomen: soft, nontender, no organomegaly or mass ?Back:  Tender at L3-5, at SI joints bilaterally, into buttocks at sciatic notches and buttocks bilaterally.  No spasm or tenderness of paraspinous muscles.  He has FROM of pyriformis, and has negative straight leg raise. ?Neuro: alert and oriented, normal gait. DTR's are symmetric in LE's.  He has normal strength, sensation.  He had some discomfort in the lower back with strength testing of LE's, but no radiation, and no weakness. ?Psych: normal mood, affect, hygiene and grooming ? ? ?  ASSESSMENT/PLAN: ? ?Acute bilateral low back pain with bilateral sciatica - recurrent.  Ddx reviewed.  XR, NSAID, PT.  Discussed NSAID vs steroids, will start with NSAID. No indication for muscle relaxant at this time - Plan: meloxicam (MOBIC) 7.5 MG tablet, Ambulatory referral to Physical Therapy, DG Lumbar Spine Complete ? ?Reviewed risks/SE of NSAIDs. Given that he had sedation from naproxen, discussed the possibility with any rx strength NSAID. ?Will start with Mobic 7.5 mg--lower dose due to sedation risk and also due to kidney risks due to him taking biktarvy.  Last Cr was normal. ? ?He will get x-ray, given recurrent nature of his pain. ?Discussed DDx, and answered all of his questions. ?Plan is also to get formal PT, to hopefully prevent further recurrences. ? ?Pt understands the plan, and agrees. ?He will reach out if he has issues tolerating medications, or if not improving. ?He knows to contact us if any changes in his symptoms (weakness, incontinence, worsening pain). ? ?I spent >40 minutes dedicated to the care of this patient, including pre-visit review of records, face to face time, post-visit ordering of testing and documentation.  Took more time also related to the use of translator, per pt request. ? ?

## 2021-09-18 NOTE — Patient Instructions (Addendum)
Go to [facility] ([address]) for an x-ray of your back. (They are closed now, go tomorrow). ? ?Take meloxicam once daily with food (dinner). Take it until your pain is better. ?(If this makes you too sleepy, let us know and we can change you back to ibuprofen, but taking it three times daily with food. ? ?STOP taking any ibuprofen while taking meloxicam. ?You may continue to take acetaminophen, and Tiger Balm, if needed and if helping. ? ?We are referring you for physical therapy. ? ? ?

## 2021-09-19 ENCOUNTER — Ambulatory Visit
Admission: RE | Admit: 2021-09-19 | Discharge: 2021-09-19 | Disposition: A | Discharge status: Home / Self Care | Payer: 59 | Source: Ambulatory Visit | Attending: Family Medicine | Admitting: Family Medicine

## 2021-09-19 ENCOUNTER — Encounter: Payer: Self-pay | Admitting: Family Medicine

## 2021-09-19 DIAGNOSIS — M5442 Lumbago with sciatica, left side: Secondary | ICD-10-CM

## 2021-09-26 ENCOUNTER — Other Ambulatory Visit: Payer: Self-pay | Admitting: Family Medicine

## 2021-09-26 ENCOUNTER — Other Ambulatory Visit (HOSPITAL_COMMUNITY): Payer: Self-pay

## 2021-09-26 ENCOUNTER — Ambulatory Visit: Payer: 59 | Admitting: Family Medicine

## 2021-09-26 DIAGNOSIS — G4700 Insomnia, unspecified: Secondary | ICD-10-CM

## 2021-09-26 MED ORDER — TRAZODONE HCL 50 MG PO TABS
ORAL_TABLET | ORAL | 3 refills | Status: DC
  Filled 2021-10-07: qty 30, 30d supply, fill #0
  Filled 2021-10-29: qty 30, 30d supply, fill #1
  Filled 2021-11-22: qty 30, 30d supply, fill #2
  Filled 2021-12-19: qty 30, 30d supply, fill #3

## 2021-09-26 NOTE — Telephone Encounter (Signed)
Pt is requesting refill on Trazodone.
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
## 2021-10-03 ENCOUNTER — Ambulatory Visit: Payer: 59 | Attending: Family Medicine | Admitting: Physical Therapy

## 2021-10-03 ENCOUNTER — Encounter: Payer: Self-pay | Admitting: Family Medicine

## 2021-10-03 ENCOUNTER — Other Ambulatory Visit: Payer: Self-pay

## 2021-10-03 DIAGNOSIS — M5441 Lumbago with sciatica, right side: Secondary | ICD-10-CM | POA: Diagnosis not present

## 2021-10-03 DIAGNOSIS — M5442 Lumbago with sciatica, left side: Secondary | ICD-10-CM | POA: Diagnosis not present

## 2021-10-03 DIAGNOSIS — M5459 Other low back pain: Secondary | ICD-10-CM | POA: Insufficient documentation

## 2021-10-03 DIAGNOSIS — M6281 Muscle weakness (generalized): Secondary | ICD-10-CM | POA: Diagnosis present

## 2021-10-03 NOTE — Patient Instructions (Signed)
Access Code: [email or web address] ?URL: [email or web address] ?Date: 10/03/2021 ?Prepared by: Rosana Hoes ? ?Exercises ?- Supine Piriformis Stretch with Foot on Ground  - 2 x daily - 2-3 reps - 30 seconds hold ?- Bridge  - 1 x daily - 7 x weekly - 2-3 sets - 10 reps - 5 seconds hold ?- Clamshell with Resistance  - 1 x daily - 7 x weekly - 2-3 sets - 10 reps ?

## 2021-10-07 ENCOUNTER — Other Ambulatory Visit (INDEPENDENT_AMBULATORY_CARE_PROVIDER_SITE_OTHER): Payer: 59

## 2021-10-07 ENCOUNTER — Other Ambulatory Visit (HOSPITAL_COMMUNITY): Payer: Self-pay

## 2021-10-07 DIAGNOSIS — Z23 Encounter for immunization: Secondary | ICD-10-CM | POA: Diagnosis not present

## 2021-10-16 ENCOUNTER — Ambulatory Visit: Payer: 59

## 2021-10-24 ENCOUNTER — Encounter: Payer: 59 | Admitting: Physical Therapy

## 2021-10-29 ENCOUNTER — Other Ambulatory Visit (HOSPITAL_COMMUNITY): Payer: Self-pay

## 2021-11-05 ENCOUNTER — Other Ambulatory Visit (HOSPITAL_COMMUNITY): Payer: Self-pay

## 2021-11-14 ENCOUNTER — Other Ambulatory Visit (HOSPITAL_COMMUNITY): Payer: Self-pay

## 2021-11-22 ENCOUNTER — Other Ambulatory Visit (HOSPITAL_COMMUNITY): Payer: Self-pay

## 2021-11-22 ENCOUNTER — Encounter: Payer: Self-pay | Admitting: Family Medicine

## 2021-12-02 ENCOUNTER — Other Ambulatory Visit (HOSPITAL_COMMUNITY): Payer: Self-pay

## 2021-12-19 ENCOUNTER — Other Ambulatory Visit (HOSPITAL_COMMUNITY): Payer: Self-pay

## 2021-12-30 ENCOUNTER — Other Ambulatory Visit (HOSPITAL_COMMUNITY): Payer: Self-pay

## 2022-01-03 ENCOUNTER — Ambulatory Visit: Payer: Self-pay | Admitting: Infectious Disease

## 2022-01-06 ENCOUNTER — Other Ambulatory Visit: Payer: 59

## 2022-01-13 ENCOUNTER — Encounter: Payer: 59 | Admitting: Family Medicine

## 2022-01-16 ENCOUNTER — Other Ambulatory Visit (HOSPITAL_COMMUNITY): Payer: Self-pay

## 2022-01-16 ENCOUNTER — Other Ambulatory Visit: Payer: Self-pay | Admitting: Family Medicine

## 2022-01-16 DIAGNOSIS — G4700 Insomnia, unspecified: Secondary | ICD-10-CM

## 2022-01-16 MED ORDER — TRAZODONE HCL 50 MG PO TABS
ORAL_TABLET | ORAL | 0 refills | Status: DC
  Filled ????-??-??: fill #0

## 2022-01-16 NOTE — Telephone Encounter (Signed)
Is this okay to refill? 

## 2022-01-29 ENCOUNTER — Ambulatory Visit: Payer: BC Managed Care – PPO | Admitting: Infectious Disease

## 2022-01-29 ENCOUNTER — Encounter: Payer: Self-pay | Admitting: Infectious Disease

## 2022-01-29 ENCOUNTER — Other Ambulatory Visit (HOSPITAL_COMMUNITY): Payer: Self-pay

## 2022-01-29 ENCOUNTER — Other Ambulatory Visit (HOSPITAL_COMMUNITY)
Admission: RE | Admit: 2022-01-29 | Discharge: 2022-01-29 | Disposition: A | Discharge status: Home / Self Care | Payer: BC Managed Care – PPO | Source: Ambulatory Visit | Attending: Infectious Disease | Admitting: Infectious Disease

## 2022-01-29 ENCOUNTER — Other Ambulatory Visit: Payer: Self-pay

## 2022-01-29 VITALS — BP 134/87 | HR 68 | Resp 16 | Ht 67.0 in | Wt 191.0 lb

## 2022-01-29 DIAGNOSIS — Z113 Encounter for screening for infections with a predominantly sexual mode of transmission: Secondary | ICD-10-CM

## 2022-01-29 DIAGNOSIS — G4700 Insomnia, unspecified: Secondary | ICD-10-CM

## 2022-01-29 DIAGNOSIS — B20 Human immunodeficiency virus [HIV] disease: Secondary | ICD-10-CM | POA: Diagnosis present

## 2022-01-29 MED ORDER — ATORVASTATIN CALCIUM 40 MG PO TABS
40.0000 mg | ORAL_TABLET | Freq: Every day | ORAL | 11 refills | Status: DC
  Filled 2022-01-29 – 2022-02-27 (×2): qty 30, 30d supply, fill #0
  Filled 2022-03-26: qty 30, 30d supply, fill #1
  Filled 2022-05-01: qty 30, 30d supply, fill #2

## 2022-01-29 MED ORDER — BICTEGRAVIR-EMTRICITAB-TENOFOV 50-200-25 MG PO TABS
1.0000 | ORAL_TABLET | Freq: Every day | ORAL | 11 refills | Status: DC
  Filled 2022-01-29: qty 30, 30d supply, fill #0
  Filled 2022-02-19: qty 30, 30d supply, fill #1
  Filled 2022-03-27: qty 30, 30d supply, fill #2
  Filled 2022-04-24: qty 30, 30d supply, fill #3
  Filled 2022-05-26 (×2): qty 30, 30d supply, fill #4

## 2022-01-29 MED ORDER — DOXYCYCLINE HYCLATE 100 MG PO TABS
ORAL_TABLET | ORAL | 6 refills | Status: DC
  Filled 2022-01-29: qty 60, 30d supply, fill #0
  Filled 2022-02-28: qty 60, 30d supply, fill #1
  Filled 2022-10-23: qty 60, 30d supply, fill #2

## 2022-01-29 MED ORDER — TRAZODONE HCL 50 MG PO TABS
ORAL_TABLET | ORAL | 11 refills | Status: DC
  Filled 2022-01-29: qty 30, 30d supply, fill #0
  Filled 2022-02-19: qty 30, 30d supply, fill #1
  Filled 2022-03-27: qty 30, 30d supply, fill #2
  Filled 2022-04-24: qty 30, 30d supply, fill #3
  Filled 2022-05-26 – 2022-05-29 (×3): qty 30, 30d supply, fill #4
  Filled 2022-06-26 – 2022-07-01 (×2): qty 30, 30d supply, fill #5
  Filled 2022-07-02 – 2022-07-22 (×2): qty 30, 30d supply, fill #6
  Filled 2022-08-25: qty 30, 30d supply, fill #7
  Filled 2022-09-24: qty 30, 30d supply, fill #8
  Filled 2022-10-28: qty 30, 30d supply, fill #9
  Filled 2022-11-26: qty 30, 30d supply, fill #10
  Filled 2022-12-04 – 2022-12-25 (×2): qty 30, 30d supply, fill #11
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
## 2022-01-30 ENCOUNTER — Other Ambulatory Visit (HOSPITAL_COMMUNITY): Payer: Self-pay

## 2022-01-30 LAB — T-HELPER CELL (CD4) - (RCID CLINIC ONLY)
CD4 % Helper T Cell: 40 % (ref 33–65)
CD4 T Cell Abs: 611 /uL (ref 400–1790)

## 2022-01-31 LAB — COMPLETE METABOLIC PANEL WITH GFR
AG Ratio: 2.2 (calc) (ref 1.0–2.5)
ALT: 18 U/L (ref 9–46)
AST: 18 U/L (ref 10–35)
Albumin: 5.1 g/dL (ref 3.6–5.1)
Alkaline phosphatase (APISO): 73 U/L (ref 35–144)
BUN: 23 mg/dL (ref 7–25)
CO2: 25 mmol/L (ref 20–32)
Calcium: 9.8 mg/dL (ref 8.6–10.3)
Chloride: 106 mmol/L (ref 98–110)
Creat: 1.16 mg/dL (ref 0.70–1.30)
Globulin: 2.3 g/dL (calc) (ref 1.9–3.7)
Glucose, Bld: 89 mg/dL (ref 65–99)
Potassium: 4 mmol/L (ref 3.5–5.3)
Sodium: 140 mmol/L (ref 135–146)
Total Bilirubin: 0.6 mg/dL (ref 0.2–1.2)
Total Protein: 7.4 g/dL (ref 6.1–8.1)
eGFR: 74 mL/min/{1.73_m2} (ref >=60)

## 2022-01-31 LAB — CYTOLOGY, (ORAL, ANAL, URETHRAL) ANCILLARY ONLY
Chlamydia: NEGATIVE
Chlamydia: NEGATIVE
Comment: NEGATIVE
Comment: NEGATIVE
Comment: NORMAL
Comment: NORMAL
Neisseria Gonorrhea: NEGATIVE
Neisseria Gonorrhea: NEGATIVE

## 2022-01-31 LAB — URINE CYTOLOGY ANCILLARY ONLY
Chlamydia: NEGATIVE
Comment: NEGATIVE
Comment: NORMAL
Neisseria Gonorrhea: NEGATIVE

## 2022-01-31 LAB — CBC WITH DIFFERENTIAL/PLATELET
Absolute Monocytes: 561 cells/uL (ref 200–950)
Basophils Absolute: 43 cells/uL (ref 0–200)
Basophils Relative: 0.7 %
Eosinophils Absolute: 201 cells/uL (ref 15–500)
Eosinophils Relative: 3.3 %
HCT: 47.7 % (ref 38.5–50.0)
Hemoglobin: 16.6 g/dL (ref 13.2–17.1)
Lymphs Abs: 1708 cells/uL (ref 850–3900)
MCH: 30.6 pg (ref 27.0–33.0)
MCHC: 34.8 g/dL (ref 32.0–36.0)
MCV: 88 fL (ref 80.0–100.0)
MPV: 10 fL (ref 7.5–12.5)
Monocytes Relative: 9.2 %
Neutro Abs: 3587 cells/uL (ref 1500–7800)
Neutrophils Relative %: 58.8 %
Platelets: 273 10*3/uL (ref 140–400)
RBC: 5.42 10*6/uL (ref 4.20–5.80)
RDW: 12.1 % (ref 11.0–15.0)
Total Lymphocyte: 28 %
WBC: 6.1 10*3/uL (ref 3.8–10.8)

## 2022-01-31 LAB — LIPID PANEL
Cholesterol: 163 mg/dL (ref <200)
HDL: 55 mg/dL (ref >=40)
LDL Cholesterol (Calc): 89 mg/dL (calc)
Non-HDL Cholesterol (Calc): 108 mg/dL (calc) (ref <130)
Total CHOL/HDL Ratio: 3 (calc) (ref <5.0)
Triglycerides: 94 mg/dL (ref <150)

## 2022-01-31 LAB — HIV-1 RNA QUANT-NO REFLEX-BLD
HIV 1 RNA Quant: NOT DETECTED Copies/mL
HIV-1 RNA Quant, Log: NOT DETECTED Log cps/mL

## 2022-01-31 LAB — RPR: RPR Ser Ql: NONREACTIVE

## 2022-02-03 ENCOUNTER — Other Ambulatory Visit (HOSPITAL_COMMUNITY): Payer: Self-pay

## 2022-02-07 ENCOUNTER — Encounter: Payer: Self-pay | Admitting: Family Medicine

## 2022-02-07 ENCOUNTER — Ambulatory Visit: Payer: BC Managed Care – PPO | Admitting: Family Medicine

## 2022-02-07 VITALS — BP 130/84 | HR 75 | Temp 97.7°F | Ht 68.0 in | Wt 191.0 lb

## 2022-02-07 DIAGNOSIS — Z23 Encounter for immunization: Secondary | ICD-10-CM | POA: Diagnosis not present

## 2022-02-07 DIAGNOSIS — Z79899 Other long term (current) drug therapy: Secondary | ICD-10-CM

## 2022-02-07 DIAGNOSIS — Z0000 Encounter for general adult medical examination without abnormal findings: Secondary | ICD-10-CM | POA: Diagnosis not present

## 2022-02-07 DIAGNOSIS — K6282 Dysplasia of anus: Secondary | ICD-10-CM

## 2022-02-07 DIAGNOSIS — Z86010 Personal history of colonic polyps: Secondary | ICD-10-CM | POA: Diagnosis not present

## 2022-02-07 DIAGNOSIS — B20 Human immunodeficiency virus [HIV] disease: Secondary | ICD-10-CM

## 2022-02-07 DIAGNOSIS — Z800 Family history of malignant neoplasm of digestive organs: Secondary | ICD-10-CM
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
## 2022-02-07 NOTE — Patient Instructions (Signed)
Health Maintenance, Male Adopting a healthy lifestyle and getting preventive care are important in promoting health and wellness. Ask your health care provider about: The right schedule for you to have regular tests and exams. Things you can do on your own to prevent diseases and keep yourself healthy. What should I know about diet, weight, and exercise? Eat a healthy diet  Eat a diet that includes plenty of vegetables, fruits, low-fat dairy products, and lean protein. Do not eat a lot of foods that are high in solid fats, added sugars, or sodium. Maintain a healthy weight Body mass index (BMI) is a measurement that can be used to identify possible weight problems. It estimates body fat based on height and weight. Your health care provider can help determine your BMI and help you achieve or maintain a healthy weight. Get regular exercise Get regular exercise. This is one of the most important things you can do for your health. Most adults should: Exercise for at least 150 minutes each week. The exercise should increase your heart rate and make you sweat (moderate-intensity exercise). Do strengthening exercises at least twice a week. This is in addition to the moderate-intensity exercise. Spend less time sitting. Even light physical activity can be beneficial. Watch cholesterol and blood lipids Have your blood tested for lipids and cholesterol at 56 years of age, then have this test every 5 years. You may need to have your cholesterol levels checked more often if: Your lipid or cholesterol levels are high. You are older than 56 years of age. You are at high risk for heart disease. What should I know about cancer screening? Many types of cancers can be detected early and may often be prevented. Depending on your health history and family history, you may need to have cancer screening at various ages. This may include screening for: Colorectal cancer. Prostate cancer. Skin cancer. Lung  cancer. What should I know about heart disease, diabetes, and high blood pressure? Blood pressure and heart disease High blood pressure causes heart disease and increases the risk of stroke. This is more likely to develop in people who have high blood pressure readings or are overweight. Talk with your health care provider about your target blood pressure readings. Have your blood pressure checked: Every 3-5 years if you are 18-39 years of age. Every year if you are 40 years old or older. If you are between the ages of 65 and 75 and are a current or former smoker, ask your health care provider if you should have a one-time screening for abdominal aortic aneurysm (AAA). Diabetes Have regular diabetes screenings. This checks your fasting blood sugar level. Have the screening done: Once every three years after age 45 if you are at a normal weight and have a low risk for diabetes. More often and at a younger age if you are overweight or have a high risk for diabetes. What should I know about preventing infection? Hepatitis B If you have a higher risk for hepatitis B, you should be screened for this virus. Talk with your health care provider to find out if you are at risk for hepatitis B infection. Hepatitis C Blood testing is recommended for: Everyone born from 1945 through 1965. Anyone with known risk factors for hepatitis C. Sexually transmitted infections (STIs) You should be screened each year for STIs, including gonorrhea and chlamydia, if: You are sexually active and are younger than 56 years of age. You are older than 56 years of age and your   health care provider tells you that you are at risk for this type of infection. Your sexual activity has changed since you were last screened, and you are at increased risk for chlamydia or gonorrhea. Ask your health care provider if you are at risk. Ask your health care provider about whether you are at high risk for HIV. Your health care provider  may recommend a prescription medicine to help prevent HIV infection. If you choose to take medicine to prevent HIV, you should first get tested for HIV. You should then be tested every 3 months for as long as you are taking the medicine. Follow these instructions at home: Alcohol use Do not drink alcohol if your health care provider tells you not to drink. If you drink alcohol: Limit how much you have to 0-2 drinks a day. Know how much alcohol is in your drink. In the [place] one drink equals one 12 oz bottle of beer (355 mL), one 5 oz glass of wine (148 mL), or one 1 oz glass of hard liquor (44 mL). Lifestyle Do not use any products that contain nicotine or tobacco. These products include cigarettes, chewing tobacco, and vaping devices, such as e-cigarettes. If you need help quitting, ask your health care provider. Do not use street drugs. Do not share needles. Ask your health care provider for help if you need support or information about quitting drugs. General instructions Schedule regular health, dental, and eye exams. Stay current with your vaccines. Tell your health care provider if: You often feel depressed. You have ever been abused or do not feel safe at home. Summary Adopting a healthy lifestyle and getting preventive care are important in promoting health and wellness. Follow your health care provider's instructions about healthy diet, exercising, and getting tested or screened for diseases. Follow your health care provider's instructions on monitoring your cholesterol and blood pressure. This information is not intended to replace advice given to you by your health care provider. Make sure you discuss any questions you have with your health care provider. Document Revised: 10/22/2020 Document Reviewed: 10/22/2020 Elsevier Patient Education  2023 Elsevier Inc.  

## 2022-02-10 ENCOUNTER — Other Ambulatory Visit (HOSPITAL_COMMUNITY): Payer: Self-pay

## 2022-02-11 ENCOUNTER — Other Ambulatory Visit (HOSPITAL_COMMUNITY): Payer: Self-pay

## 2022-02-18 ENCOUNTER — Other Ambulatory Visit (HOSPITAL_COMMUNITY): Payer: Self-pay

## 2022-02-19 ENCOUNTER — Other Ambulatory Visit (HOSPITAL_COMMUNITY): Payer: Self-pay

## 2022-02-27 ENCOUNTER — Other Ambulatory Visit (HOSPITAL_COMMUNITY): Payer: Self-pay

## 2022-02-28 ENCOUNTER — Other Ambulatory Visit (HOSPITAL_COMMUNITY): Payer: Self-pay

## 2022-03-10 ENCOUNTER — Other Ambulatory Visit (HOSPITAL_COMMUNITY): Payer: Self-pay

## 2022-03-19 ENCOUNTER — Encounter: Payer: Self-pay | Admitting: Gastroenterology

## 2022-03-19 ENCOUNTER — Telehealth: Payer: Self-pay | Admitting: Gastroenterology

## 2022-03-25 ENCOUNTER — Encounter: Payer: Self-pay | Admitting: Internal Medicine

## 2022-03-26 ENCOUNTER — Other Ambulatory Visit (HOSPITAL_COMMUNITY): Payer: Self-pay

## 2022-03-27 ENCOUNTER — Other Ambulatory Visit (HOSPITAL_COMMUNITY): Payer: Self-pay

## 2022-04-01 ENCOUNTER — Other Ambulatory Visit (HOSPITAL_COMMUNITY): Payer: Self-pay

## 2022-04-03 ENCOUNTER — Ambulatory Visit (AMBULATORY_SURGERY_CENTER): Payer: Self-pay

## 2022-04-03 ENCOUNTER — Other Ambulatory Visit (HOSPITAL_COMMUNITY): Payer: Self-pay

## 2022-04-03 VITALS — Ht 68.0 in | Wt 190.0 lb

## 2022-04-03 DIAGNOSIS — Z1211 Encounter for screening for malignant neoplasm of colon: Secondary | ICD-10-CM

## 2022-04-03 MED ORDER — NA SULFATE-K SULFATE-MG SULF 17.5-3.13-1.6 GM/177ML PO SOLN: 1.0000 | ORAL | 0 refills | Status: DC

## 2022-04-03 NOTE — Progress Notes (Signed)
No egg or soy allergy known to patient  No issues known to pt with past sedation with any surgeries or procedures Patient denies ever being told they had issues or difficulty with intubation  No FH of Malignant Hyperthermia Pt is not on diet pills Pt is not on  home 02  Pt is not on blood thinners  Pt denies issues with constipation  No A fib or A flutter Have any cardiac testing pending--denied Pt instructed to use Singlecare.com or GoodRx for a price reduction on prep   PV conducted in person without interpreter. Pt verbalized understanding throughout and acknowledged understanding of printed Spanish Suprep instructions.   Patient's chart reviewed by Osvaldo Angst CNRA prior to previsit and patient appropriate for the [place].  Previsit completed and red dot placed by patient's name on their procedure day (on provider's schedule).

## 2022-04-04 ENCOUNTER — Telehealth: Payer: Self-pay

## 2022-04-04 NOTE — Telephone Encounter (Signed)
Inbound call from patient husband, he wanted to advise that the patients father died of lung cancer, his mother had colon cancer was in remission but later died from intestinal, ovarian, uterine cancer. His paternal grandfather had stomach cancer and paternal grandmother had liver cancer.

## 2022-04-04 NOTE — Telephone Encounter (Signed)
Family Hx updated in chart.

## 2022-04-07 ENCOUNTER — Encounter: Payer: Self-pay | Admitting: Internal Medicine

## 2022-04-21 ENCOUNTER — Encounter: Payer: Self-pay | Admitting: Gastroenterology

## 2022-04-24 ENCOUNTER — Other Ambulatory Visit (HOSPITAL_COMMUNITY): Payer: Self-pay

## 2022-04-30 ENCOUNTER — Ambulatory Visit (AMBULATORY_SURGERY_CENTER): Payer: BC Managed Care – PPO | Admitting: Gastroenterology

## 2022-04-30 ENCOUNTER — Encounter: Payer: Self-pay | Admitting: Gastroenterology

## 2022-04-30 VITALS — BP 129/85 | HR 70 | Temp 97.8°F | Resp 14 | Ht 68.0 in | Wt 190.0 lb

## 2022-04-30 DIAGNOSIS — Z1211 Encounter for screening for malignant neoplasm of colon: Secondary | ICD-10-CM

## 2022-04-30 MED ORDER — SODIUM CHLORIDE 0.9 % IV SOLN: 500.0000 mL | Freq: Once | INTRAVENOUS | Status: DC

## 2022-04-30 NOTE — Progress Notes (Signed)
Report to PACU, RN, vss, BBS= Clear.  

## 2022-04-30 NOTE — Patient Instructions (Addendum)
Continue present medications. Resume previous diet.   Repeat colonoscopy in 10 years for screening purposes.   Handouts provided on diverticulosis and hemorrhoids.   USTED TUVO UN PROCEDIMIENTO ENDOSCPICO HOY EN EL [facility]:   Lea el informe del procedimiento que se le entreg para cualquier pregunta especfica sobre lo que se Dentist.  Si el informe del examen no responde a sus preguntas, por favor llame a su gastroenterlogo para aclararlo.  Si usted solicit que no se le den [employer] de lo que se Clinical cytogeneticist en su procedimiento al [employer] va a cuidar, entonces el informe del procedimiento se ha incluido en un sobre sellado para que usted lo revise despus cuando le sea ms conveniente.   LO QUE PUEDE ESPERAR: Algunas sensaciones de hinchazn en el abdomen.  Puede tener ms gases de lo normal.  El caminar puede ayudarle a eliminar el aire que se le puso en el tracto gastrointestinal durante el procedimiento y reducir la hinchazn.  Si le hicieron una endoscopia inferior (como una colonoscopia o una sigmoidoscopia flexible), podra notar manchas de sangre en las heces fecales o en el papel higinico.  Si se someti a una preparacin intestinal para su procedimiento, es posible que no tenga una evacuacin intestinal normal durante [employer].   Tenga en cuenta:  Es posible que note un poco de irritacin y congestin en la nariz o algn drenaje.  Esto es debido al oxgeno [employer] durante su procedimiento.  No hay que preocuparse y esto debe desaparecer ms o Regulatory affairs officer.   SNTOMAS PARA REPORTAR INMEDIATAMENTE:  Despus de una endoscopia inferior (colonoscopia o sigmoidoscopia flexible):  Cantidades excesivas de sangre en las heces fecales  Sensibilidad significativa o empeoramiento de los dolores abdominales   Hinchazn aguda del abdomen que antes no tena   Fiebre de 100F o ms    Para asuntos urgentes o de Associate Professor, puede comunicarse con un  gastroenterlogo a cualquier hora llamando al [phone].  DIETA:  Recomendamos una comida pequea al principio, pero luego puede continuar con su dieta normal.  Tome muchos lquidos, Tax adviser las bebidas alcohlicas durante 24 horas.    ACTIVIDAD:  Debe planear tomarse las cosas con calma por el resto del da y no debe CONDUCIR ni usar maquinaria pesada Patent examiner (debido a los medicamentos de sedacin utilizados durante el examen).     SEGUIMIENTO: Nuestro personal llamar al nmero que aparece en su historial al siguiente da hbil de su procedimiento para ver cmo se siente y para responder cualquier pregunta o inquietud que pueda tener con respecto a la informacin que se le dio despus del procedimiento. Si no podemos contactarle, le dejaremos un mensaje.  Sin embargo, si se siente bien y no tiene English as a second language teacher, no es necesario que nos devuelva la llamada.  Asumiremos que ha regresado a sus actividades diarias normales sin incidentes. Si se le tomaron algunas biopsias, le contactaremos por telfono o por carta en las prximas 3 semanas.  Si no ha sabido [employer] biopsias en el transcurso de 3 semanas, por favor llmenos al [phone].   FIRMAS/CONFIDENCIALIDAD: Usted y/o el acompaante que le cuide han firmado documentos que se ingresarn en su historial mdico electrnico.  Estas firmas atestiguan el hecho de que la informacin anterior     YOU HAD AN ENDOSCOPIC PROCEDURE TODAY AT THE [facility]:   Refer to the procedure report that was given to you for any specific  questions about what was found during the examination.  If the procedure report does not answer your questions, please call your gastroenterologist to clarify.  If you requested that your care partner not be given the details of your procedure findings, then the procedure report has been included in a sealed envelope for you to review at your convenience later.  YOU SHOULD EXPECT: Some  feelings of bloating in the abdomen. Passage of more gas than usual.  Walking can help get rid of the air that was put into your GI tract during the procedure and reduce the bloating. If you had a lower endoscopy (such as a colonoscopy or flexible sigmoidoscopy) you may notice spotting of blood in your stool or on the toilet paper. If you underwent a bowel prep for your procedure, you may not have a normal bowel movement for a few days.  Please Note:  You might notice some irritation and congestion in your nose or some drainage.  This is from the oxygen used during your procedure.  There is no need for concern and it should clear up in a day or so.  SYMPTOMS TO REPORT IMMEDIATELY:  Following lower endoscopy (colonoscopy or flexible sigmoidoscopy):  Excessive amounts of blood in the stool  Significant tenderness or worsening of abdominal pains  Swelling of the abdomen that is new, acute  Fever of 100F or higher  For urgent or emergent issues, a gastroenterologist can be reached at any hour by calling [phone]. Do not use MyChart messaging for urgent concerns.    DIET:  We do recommend a small meal at first, but then you may proceed to your regular diet.  Drink plenty of fluids but you should avoid alcoholic beverages for 24 hours.  ACTIVITY:  You should plan to take it easy for the rest of today and you should NOT DRIVE or use heavy machinery until tomorrow (because of the sedation medicines used during the test).    FOLLOW UP: Our staff will call the number listed on your records the next business day following your procedure.  We will call around 7:15- 8:00 am to check on you and address any questions or concerns that you may have regarding the information given to you following your procedure. If we do not reach you, we will leave a message.     If any biopsies were taken you will be contacted by phone or by letter within the next 1-3 weeks.  Please call us at [phone] if you  have not heard about the biopsies in 3 weeks.    SIGNATURES/CONFIDENTIALITY: You and/or your care partner have signed paperwork which will be entered into your electronic medical record.  These signatures attest to the fact that that the information above on your After Visit Summary has been reviewed and is understood.  Full responsibility of the confidentiality of this discharge information lies with you and/or your care-partner.
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
## 2022-04-30 NOTE — Op Note (Signed)
[facility] Patient Name: Edward Gillespie Procedure Date: 04/30/2022 8:12 AM MRN: [id number] Endoscopist: Napoleon Form , MD, [id number] Age: 56 Referring MD:  Date of Birth: 1965-06-27 Gender: Male Account #: [id number] Procedure:                Colonoscopy Indications:              Screening for colorectal malignant neoplasm Medicines:                Monitored Anesthesia Care Procedure:                Pre-Anesthesia Assessment:                           - Prior to the procedure, a History and Physical                            was performed, and patient medications and                            allergies were reviewed. The patient's tolerance of                            previous anesthesia was also reviewed. The risks                            and benefits of the procedure and the sedation                            options and risks were discussed with the patient.                            All questions were answered, and informed consent                            was obtained. Prior Anticoagulants: The patient has                            taken no anticoagulant or antiplatelet agents. ASA                            Grade Assessment: III - A patient with severe                            systemic disease. After reviewing the risks and                            benefits, the patient was deemed in satisfactory                            condition to undergo the procedure.                           After obtaining informed consent, the colonoscope  was passed under direct vision. Throughout the                            procedure, the patient's blood pressure, pulse, and                            oxygen saturations were monitored continuously. The                            PCF-HQ190L Colonoscope was introduced through the                            anus and advanced to the the cecum, identified by                             appendiceal orifice and ileocecal valve. The                            colonoscopy was performed without difficulty. The                            patient tolerated the procedure well. The quality                            of the bowel preparation was excellent. The                            ileocecal valve, appendiceal orifice, and rectum                            were photographed. Scope In: 8:15:32 AM Scope Out: 8:29:04 AM Scope Withdrawal Time: 0 hours 8 minutes 43 seconds  Total Procedure Duration: 0 hours 13 minutes 32 seconds  Findings:                 The perianal and digital rectal examinations were                            normal.                           Scattered small-mouthed diverticula were found in                            the sigmoid colon.                           Non-bleeding external and internal hemorrhoids were                            found during retroflexion. The hemorrhoids were                            small. Complications:            No immediate complications. Estimated Blood Loss:     Estimated blood loss:  none. Impression:               - Diverticulosis in the sigmoid colon.                           - Non-bleeding external and internal hemorrhoids.                           - No specimens collected. Recommendation:           - Resume previous diet.                           - Continue present medications.                           - Repeat colonoscopy in 10 years for screening                            purposes. Napoleon Form, MD 04/30/2022 8:37:17 AM This report has been signed electronically.

## 2022-04-30 NOTE — Progress Notes (Signed)
Pt's states no medical or surgical changes since previsit or office visit. 

## 2022-05-01 ENCOUNTER — Telehealth: Payer: Self-pay

## 2022-05-01 ENCOUNTER — Other Ambulatory Visit (HOSPITAL_COMMUNITY): Payer: Self-pay

## 2022-05-01 NOTE — Telephone Encounter (Signed)
  Follow up Call-     04/30/2022    7:23 AM  Call back number  Post procedure Call Back phone  # [phone]  Permission to leave phone message Yes     Patient questions:  Do you have a fever, pain , or abdominal swelling? No. Pain Score  0 *  Have you tolerated food without any problems? Yes.    Have you been able to return to your normal activities? Yes.    Do you have any questions about your discharge instructions: Diet   No. Medications  No. Follow up visit  No.  Do you have questions or concerns about your Care? No.  Actions: * If pain score is 4 or above: No action needed, pain <4.

## 2022-05-02 ENCOUNTER — Other Ambulatory Visit (HOSPITAL_COMMUNITY): Payer: Self-pay

## 2022-05-06 ENCOUNTER — Other Ambulatory Visit: Payer: Self-pay | Admitting: Family Medicine

## 2022-05-06 DIAGNOSIS — M5441 Lumbago with sciatica, right side: Secondary | ICD-10-CM

## 2022-05-07 ENCOUNTER — Ambulatory Visit (INDEPENDENT_AMBULATORY_CARE_PROVIDER_SITE_OTHER): Payer: BC Managed Care – PPO | Admitting: Family Medicine

## 2022-05-07 ENCOUNTER — Encounter: Payer: Self-pay | Admitting: Family Medicine

## 2022-05-07 VITALS — BP 134/88 | HR 60 | Ht 68.0 in | Wt 199.8 lb

## 2022-05-07 DIAGNOSIS — M461 Sacroiliitis, not elsewhere classified: Secondary | ICD-10-CM | POA: Diagnosis not present

## 2022-05-07 NOTE — Progress Notes (Signed)
   Subjective:    Patient ID: Edward Gillespie, male    DOB: Feb 18, 1966, 56 y.o.   MRN: [id number]  HPI He is here for evaluation of some left lower back pain.  He states that he does have a previous history of herniated disc when he was living in [place] and they recommended surgery.  He thinks it is a reoccurrence of this.  He has no numbness tingling or weakness.   Review of Systems     Objective:   Physical Exam Back exam shows tenderness to palpation over the right SI joint.  Provocative testing including figure 4 was positive.  Negative straight leg raising.  Normal DTRs.       Assessment & Plan:  Sacroiliitis (HCC) I explained that his pain is not herniated disc in nature but sacroiliitis.  Explained this as well as I could to him.  Instructed and demonstrated heat and stretching as well as 2 Aleve twice per day.  He will call if he has further trouble.

## 2022-05-07 NOTE — Patient Instructions (Signed)
He has sacroiliac .heat for 20 minutes 3 times per day .  2 Aleve twice per day.  Gentle stretching after you use the heat okay okay

## 2022-05-26 ENCOUNTER — Other Ambulatory Visit (HOSPITAL_COMMUNITY): Payer: Self-pay

## 2022-05-28 ENCOUNTER — Other Ambulatory Visit (HOSPITAL_COMMUNITY): Payer: Self-pay

## 2022-05-28 ENCOUNTER — Other Ambulatory Visit (HOSPITAL_COMMUNITY)
Admission: RE | Admit: 2022-05-28 | Discharge: 2022-05-28 | Disposition: A | Discharge status: Home / Self Care | Payer: BC Managed Care – PPO | Source: Ambulatory Visit | Attending: Infectious Disease | Admitting: Infectious Disease

## 2022-05-28 ENCOUNTER — Other Ambulatory Visit: Payer: Self-pay

## 2022-05-28 ENCOUNTER — Ambulatory Visit: Payer: BC Managed Care – PPO | Admitting: Infectious Disease

## 2022-05-28 ENCOUNTER — Encounter: Payer: Self-pay | Admitting: Infectious Disease

## 2022-05-28 VITALS — BP 150/92 | HR 70 | Temp 97.3°F | Ht 67.0 in | Wt 204.0 lb

## 2022-05-28 DIAGNOSIS — Z789 Other specified health status: Secondary | ICD-10-CM

## 2022-05-28 DIAGNOSIS — B20 Human immunodeficiency virus [HIV] disease: Secondary | ICD-10-CM

## 2022-05-28 DIAGNOSIS — I10 Essential (primary) hypertension: Secondary | ICD-10-CM

## 2022-05-28 DIAGNOSIS — E785 Hyperlipidemia, unspecified: Secondary | ICD-10-CM | POA: Insufficient documentation

## 2022-05-28 DIAGNOSIS — R85610 Atypical squamous cells of undetermined significance on cytologic smear of anus (ASC-US): Secondary | ICD-10-CM | POA: Insufficient documentation

## 2022-05-28 DIAGNOSIS — Z113 Encounter for screening for infections with a predominantly sexual mode of transmission: Secondary | ICD-10-CM

## 2022-05-28 DIAGNOSIS — Z7184 Encounter for health counseling related to travel: Secondary | ICD-10-CM

## 2022-05-28 DIAGNOSIS — Z7185 Encounter for immunization safety counseling: Secondary | ICD-10-CM

## 2022-05-28 HISTORY — DX: Hyperlipidemia, unspecified: E78.5

## 2022-05-28 MED ORDER — ATORVASTATIN CALCIUM 40 MG PO TABS
40.0000 mg | ORAL_TABLET | Freq: Every day | ORAL | 11 refills | Status: DC
  Filled 2022-05-28 – 2022-06-01 (×2): qty 30, 30d supply, fill #0
  Filled 2022-06-27: qty 30, 30d supply, fill #1
  Filled 2022-06-30 – 2022-07-29 (×3): qty 30, 30d supply, fill #2
  Filled 2022-08-25: qty 30, 30d supply, fill #3
  Filled 2022-09-24: qty 30, 30d supply, fill #4
  Filled 2022-10-28: qty 30, 30d supply, fill #5
  Filled 2022-12-04: qty 30, 30d supply, fill #6
  Filled 2022-12-29: qty 30, 30d supply, fill #7
  Filled 2023-01-30: qty 30, 30d supply, fill #8
  Filled 2023-02-27: qty 30, 30d supply, fill #9

## 2022-05-28 MED ORDER — BICTEGRAVIR-EMTRICITAB-TENOFOV 50-200-25 MG PO TABS
1.0000 | ORAL_TABLET | Freq: Every day | ORAL | 11 refills | Status: DC
  Filled 2022-05-28: qty 30, 30d supply, fill #0
  Filled 2022-06-26 – 2022-07-01 (×2): qty 30, 30d supply, fill #1
  Filled 2022-07-22: qty 30, 30d supply, fill #2
  Filled 2022-08-25: qty 30, 30d supply, fill #3
  Filled 2022-09-24: qty 30, 30d supply, fill #4
  Filled 2022-10-28 – 2022-10-29 (×2): qty 30, 30d supply, fill #5
  Filled 2022-11-26: qty 30, 30d supply, fill #6
  Filled 2022-12-25: qty 30, 30d supply, fill #7
  Filled 2023-01-23: qty 30, 30d supply, fill #8
  Filled 2023-02-20: qty 30, 30d supply, fill #9
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
## 2022-05-29 ENCOUNTER — Other Ambulatory Visit (HOSPITAL_COMMUNITY): Payer: Self-pay

## 2022-05-29 ENCOUNTER — Other Ambulatory Visit: Payer: Self-pay

## 2022-05-29 LAB — URINE CYTOLOGY ANCILLARY ONLY
Chlamydia: NEGATIVE
Comment: NEGATIVE
Comment: NORMAL
Neisseria Gonorrhea: NEGATIVE

## 2022-05-29 LAB — CYTOLOGY, (ORAL, ANAL, URETHRAL) ANCILLARY ONLY
Chlamydia: NEGATIVE
Chlamydia: NEGATIVE
Comment: NEGATIVE
Comment: NEGATIVE
Comment: NORMAL
Comment: NORMAL
Neisseria Gonorrhea: NEGATIVE
Neisseria Gonorrhea: NEGATIVE

## 2022-05-29 LAB — T-HELPER CELLS (CD4) COUNT (NOT AT ARMC)
CD4 % Helper T Cell: 39 % (ref 33–65)
CD4 T Cell Abs: 589 /uL (ref 400–1790)

## 2022-05-30 ENCOUNTER — Other Ambulatory Visit: Payer: Self-pay

## 2022-05-31 LAB — CBC WITH DIFFERENTIAL/PLATELET
Absolute Monocytes: 532 cells/uL (ref 200–950)
Basophils Absolute: 50 cells/uL (ref 0–200)
Basophils Relative: 0.9 %
Eosinophils Absolute: 230 cells/uL (ref 15–500)
Eosinophils Relative: 4.1 %
HCT: 44 % (ref 38.5–50.0)
Hemoglobin: 15.4 g/dL (ref 13.2–17.1)
Lymphs Abs: 1529 cells/uL (ref 850–3900)
MCH: 31 pg (ref 27.0–33.0)
MCHC: 35 g/dL (ref 32.0–36.0)
MCV: 88.5 fL (ref 80.0–100.0)
MPV: 9.7 fL (ref 7.5–12.5)
Monocytes Relative: 9.5 %
Neutro Abs: 3259 cells/uL (ref 1500–7800)
Neutrophils Relative %: 58.2 %
Platelets: 306 10*3/uL (ref 140–400)
RBC: 4.97 10*6/uL (ref 4.20–5.80)
RDW: 12.9 % (ref 11.0–15.0)
Total Lymphocyte: 27.3 %
WBC: 5.6 10*3/uL (ref 3.8–10.8)

## 2022-05-31 LAB — RPR: RPR Ser Ql: NONREACTIVE

## 2022-05-31 LAB — COMPLETE METABOLIC PANEL WITH GFR
AG Ratio: 2.1 (calc) (ref 1.0–2.5)
ALT: 18 U/L (ref 9–46)
AST: 20 U/L (ref 10–35)
Albumin: 4.9 g/dL (ref 3.6–5.1)
Alkaline phosphatase (APISO): 70 U/L (ref 35–144)
BUN: 24 mg/dL (ref 7–25)
CO2: 25 mmol/L (ref 20–32)
Calcium: 9.7 mg/dL (ref 8.6–10.3)
Chloride: 106 mmol/L (ref 98–110)
Creat: 1.12 mg/dL (ref 0.70–1.30)
Globulin: 2.3 g/dL (calc) (ref 1.9–3.7)
Glucose, Bld: 124 mg/dL — ABNORMAL HIGH (ref 65–99)
Potassium: 4 mmol/L (ref 3.5–5.3)
Sodium: 140 mmol/L (ref 135–146)
Total Bilirubin: 0.4 mg/dL (ref 0.2–1.2)
Total Protein: 7.2 g/dL (ref 6.1–8.1)
eGFR: 78 mL/min/{1.73_m2} (ref >=60)

## 2022-05-31 LAB — HIV-1 RNA QUANT-NO REFLEX-BLD
HIV 1 RNA Quant: NOT DETECTED Copies/mL
HIV-1 RNA Quant, Log: NOT DETECTED Log cps/mL

## 2022-06-02 ENCOUNTER — Other Ambulatory Visit (HOSPITAL_COMMUNITY): Payer: Self-pay

## 2022-06-02 ENCOUNTER — Other Ambulatory Visit: Payer: Self-pay

## 2022-06-23 ENCOUNTER — Other Ambulatory Visit (HOSPITAL_COMMUNITY): Payer: Self-pay

## 2022-06-26 ENCOUNTER — Other Ambulatory Visit (HOSPITAL_COMMUNITY): Payer: Self-pay

## 2022-06-27 ENCOUNTER — Other Ambulatory Visit (HOSPITAL_COMMUNITY): Payer: Self-pay

## 2022-06-30 ENCOUNTER — Other Ambulatory Visit (HOSPITAL_COMMUNITY): Payer: Self-pay

## 2022-07-01 ENCOUNTER — Other Ambulatory Visit (HOSPITAL_COMMUNITY): Payer: Self-pay

## 2022-07-02 ENCOUNTER — Other Ambulatory Visit (HOSPITAL_COMMUNITY): Payer: Self-pay

## 2022-07-22 ENCOUNTER — Other Ambulatory Visit (HOSPITAL_COMMUNITY): Payer: Self-pay

## 2022-07-28 ENCOUNTER — Other Ambulatory Visit: Payer: Self-pay

## 2022-07-28 ENCOUNTER — Other Ambulatory Visit (HOSPITAL_COMMUNITY): Payer: Self-pay

## 2022-07-29 ENCOUNTER — Other Ambulatory Visit (HOSPITAL_COMMUNITY): Payer: Self-pay

## 2022-07-30 ENCOUNTER — Other Ambulatory Visit: Payer: Self-pay

## 2022-07-31 ENCOUNTER — Other Ambulatory Visit: Payer: Self-pay

## 2022-08-11 ENCOUNTER — Other Ambulatory Visit (HOSPITAL_COMMUNITY): Payer: Self-pay

## 2022-08-25 ENCOUNTER — Other Ambulatory Visit (HOSPITAL_COMMUNITY): Payer: Self-pay

## 2022-08-28 ENCOUNTER — Other Ambulatory Visit (HOSPITAL_COMMUNITY): Payer: Self-pay

## 2022-08-29 ENCOUNTER — Other Ambulatory Visit (HOSPITAL_COMMUNITY): Payer: Self-pay

## 2022-08-29 ENCOUNTER — Other Ambulatory Visit: Payer: Self-pay

## 2022-09-01 ENCOUNTER — Other Ambulatory Visit (HOSPITAL_COMMUNITY): Payer: Self-pay

## 2022-09-05 IMAGING — CR DG LUMBAR SPINE COMPLETE 4+V
5 series · 5 of 5 positions shown · non-contrast
Comparison: None.

CLINICAL DATA: Acute bilateral low back pain with bilateral
sciatica. Recurrent. Low back pain (L3-5) with bilateral sciatica.
Left-sided low back pain radiating down bilateral legs for 2 weeks,
and notes patient reports he heard a pop.

EXAM:
LUMBAR SPINE - COMPLETE 4+ VIEW

[w lumbar spine ap]
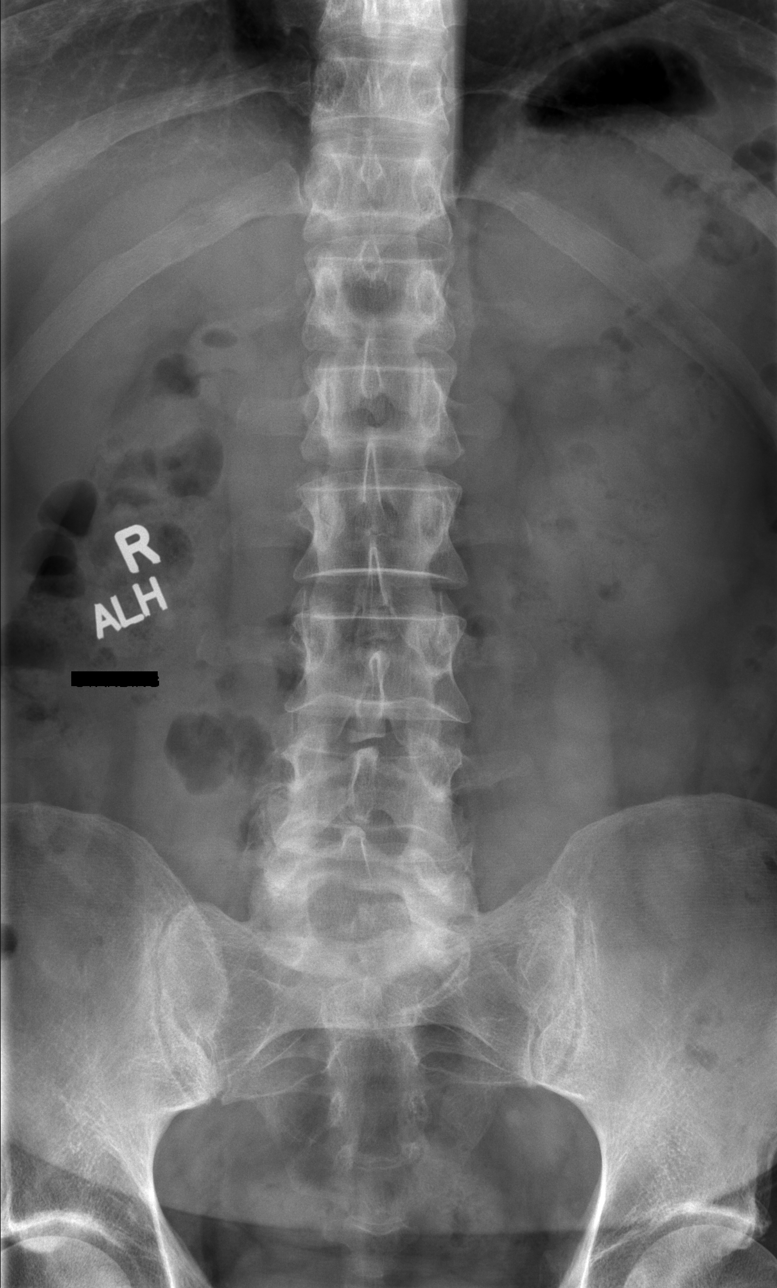

[w lumbar spine obl (1 of 2)]
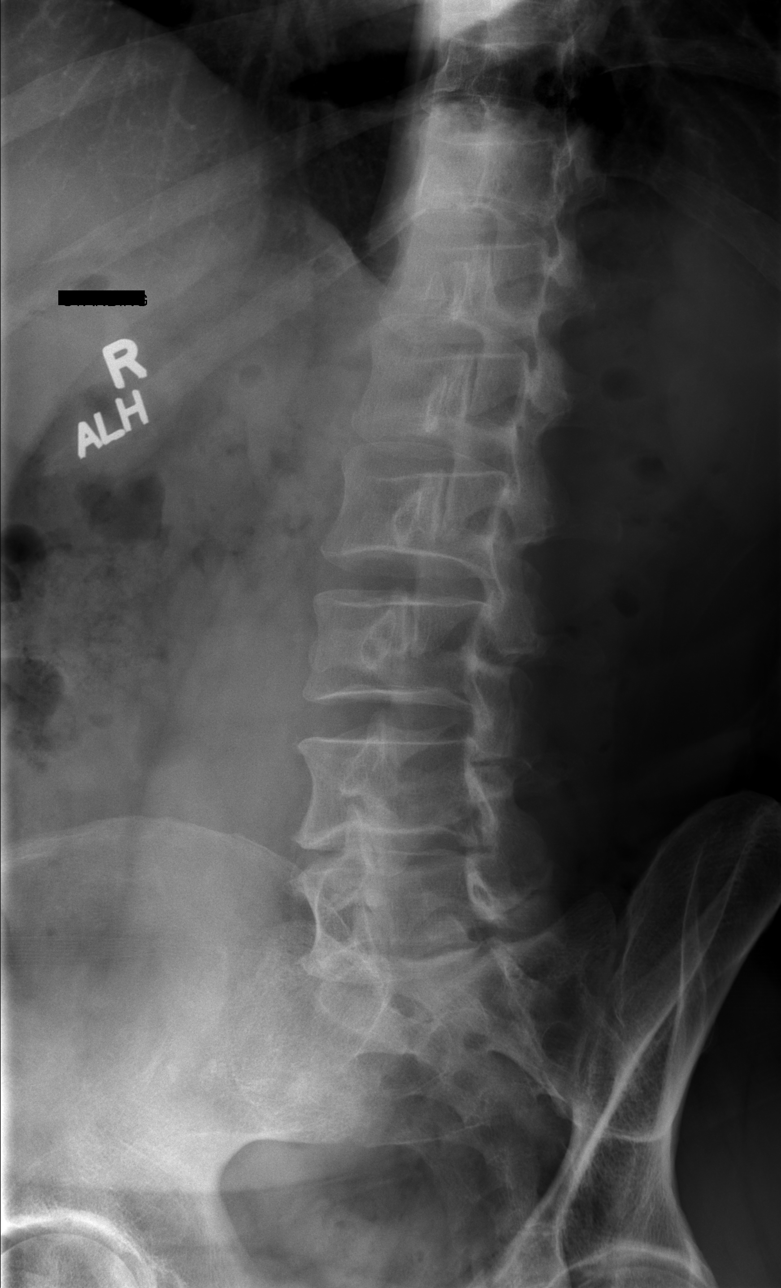

[w lumbar spine obl (2 of 2)]
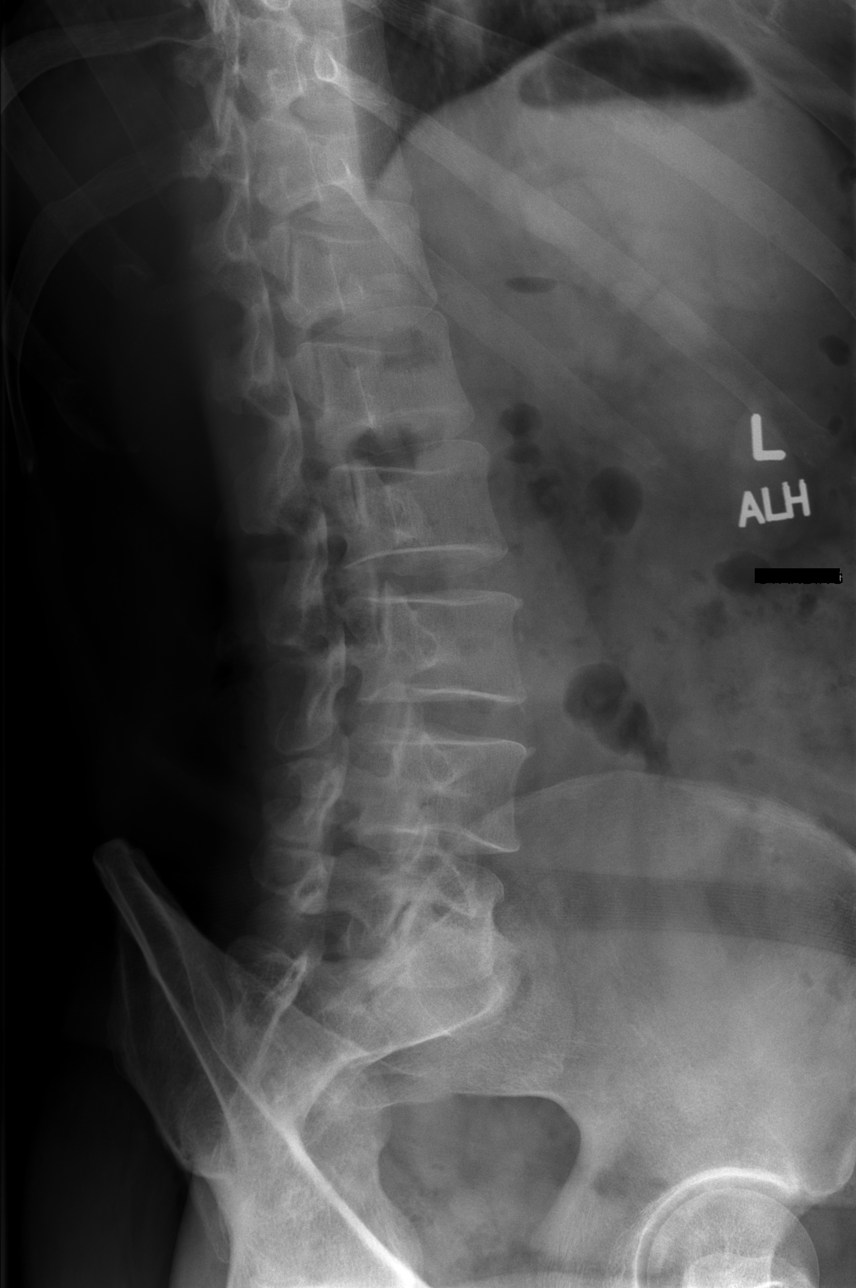

[w lumbar spine lat]
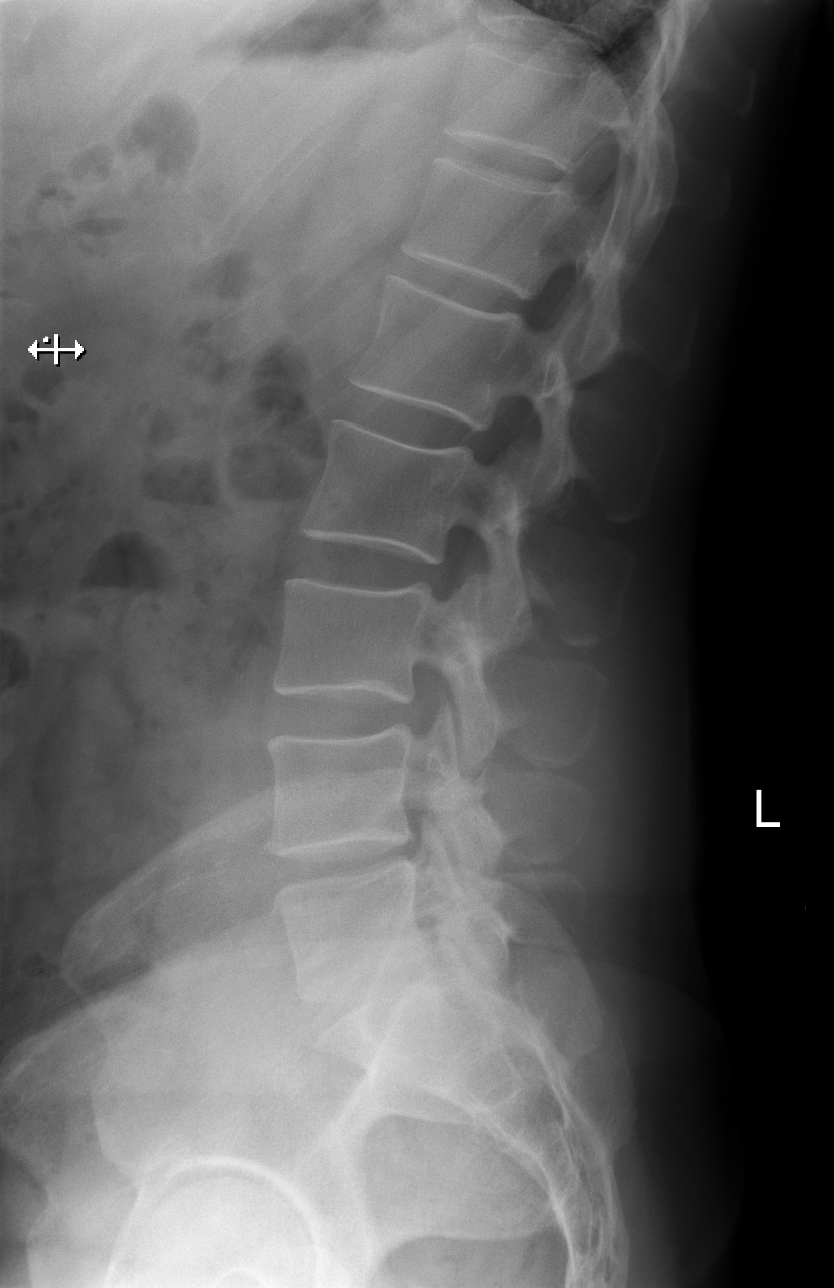

[w lumbar l-5 s-1 spot]
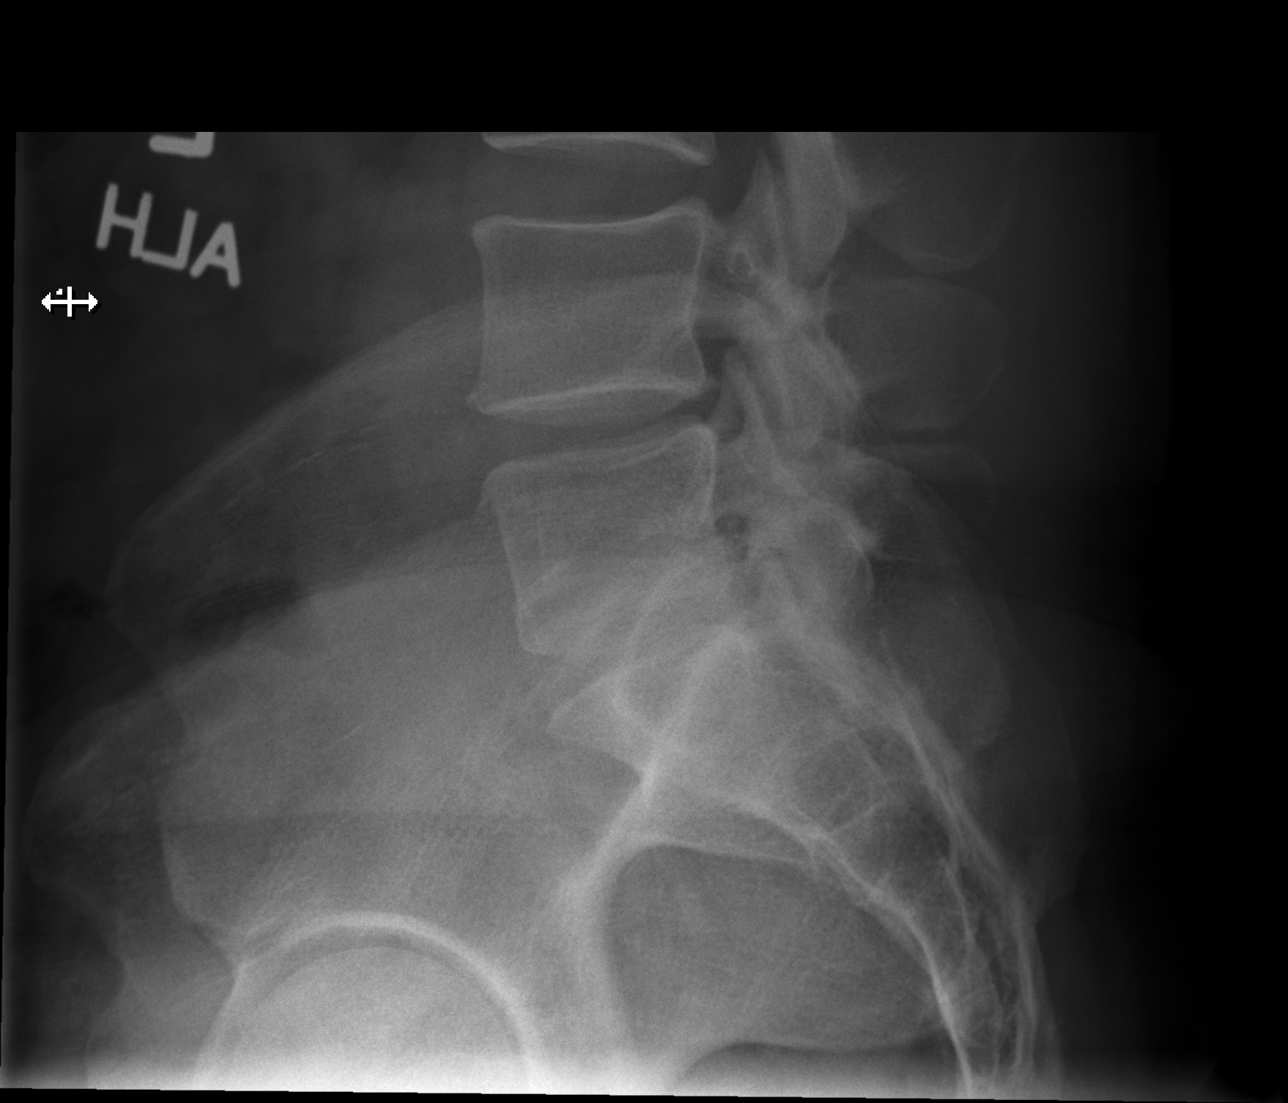

[5 of 5 positions shown; findings below may reference images not displayed]

FINDINGS: Suspected diminutive ribs at T12 with 5 non-rib-bearing lumbar
vertebra. Normal lumbar alignment. Mild disc space narrowing and
spurring at L4-L5. Remaining disc spaces are preserved. There is
mild L4-L5 and L5-S1 facet hypertrophy. Normal vertebral body
heights. No evidence of fracture, pars defects, or focal bone
abnormality. The sacroiliac joints are congruent.
IMPRESSION: Mild degenerative disc disease at L4-L5. Mild facet hypertrophy at
L4-L5 and L5-S1.

## 2022-09-24 ENCOUNTER — Other Ambulatory Visit (HOSPITAL_COMMUNITY): Payer: Self-pay

## 2022-10-02 ENCOUNTER — Other Ambulatory Visit: Payer: Self-pay

## 2022-10-23 ENCOUNTER — Other Ambulatory Visit (HOSPITAL_COMMUNITY): Payer: Self-pay

## 2022-10-29 ENCOUNTER — Other Ambulatory Visit: Payer: Self-pay

## 2022-10-29 ENCOUNTER — Other Ambulatory Visit (HOSPITAL_COMMUNITY): Payer: Self-pay

## 2022-11-03 ENCOUNTER — Other Ambulatory Visit (HOSPITAL_COMMUNITY): Payer: Self-pay

## 2022-11-26 ENCOUNTER — Other Ambulatory Visit: Payer: Self-pay

## 2022-12-02 ENCOUNTER — Other Ambulatory Visit (HOSPITAL_COMMUNITY): Payer: Self-pay

## 2022-12-03 ENCOUNTER — Ambulatory Visit: Payer: BC Managed Care – PPO | Admitting: Infectious Disease

## 2022-12-04 ENCOUNTER — Other Ambulatory Visit (HOSPITAL_COMMUNITY): Payer: Self-pay

## 2022-12-23 ENCOUNTER — Other Ambulatory Visit (HOSPITAL_COMMUNITY): Payer: Self-pay

## 2022-12-25 ENCOUNTER — Other Ambulatory Visit (HOSPITAL_COMMUNITY): Payer: Self-pay

## 2022-12-30 ENCOUNTER — Other Ambulatory Visit: Payer: Self-pay

## 2022-12-30 ENCOUNTER — Other Ambulatory Visit (HOSPITAL_COMMUNITY): Payer: Self-pay

## 2022-12-31 ENCOUNTER — Ambulatory Visit: Payer: BC Managed Care – PPO | Admitting: Family Medicine

## 2022-12-31 ENCOUNTER — Other Ambulatory Visit: Payer: Self-pay

## 2022-12-31 ENCOUNTER — Encounter: Payer: Self-pay | Admitting: Family Medicine

## 2022-12-31 ENCOUNTER — Other Ambulatory Visit (HOSPITAL_COMMUNITY): Payer: Self-pay

## 2022-12-31 VITALS — BP 130/82 | HR 88 | Temp 97.9°F | Resp 16 | Wt 206.8 lb

## 2022-12-31 DIAGNOSIS — R61 Generalized hyperhidrosis: Secondary | ICD-10-CM

## 2022-12-31 DIAGNOSIS — R0602 Shortness of breath: Secondary | ICD-10-CM

## 2022-12-31 DIAGNOSIS — R5383 Other fatigue: Secondary | ICD-10-CM

## 2022-12-31 NOTE — Progress Notes (Signed)
   Subjective:    Patient ID: Edward Gillespie, male    DOB: 1965-08-22, 57 y.o.   MRN: [id number]  HPI He is here for consult concerning hyperhidrosis, shortness of breath, fatigue feeling like his legs are weak left arm numbness.  He has had the symptoms for the last several months.  No history of heart disease.  He does not smoke.   Review of Systems     Objective:    Physical Exam Alert and in no distress.  No carotid bruits noted.  Tympanic membranes and canals are normal. Pharyngeal area is normal. Neck is supple without adenopathy or thyromegaly. Cardiac exam shows a regular sinus rhythm without murmurs or gallops. Lungs are clear to auscultation.  No peripheral edema noted.  Exam of the hands shows normal capillary refill and no discoloration. EKG read by me today shows a rate of 73 with other parameters being normal.  Normal ekg       Assessment & Plan:  Shortness of breath - Plan: EKG 12-Lead  Hyperhidrosis  Other fatigue - Plan: CBC with Differential/Platelet, Comprehensive metabolic panel, Lipid panel, TSH Hard to say exactly what is going on although there is certainly a possibility of cardiac related issue.  I will follow-up after blood work.

## 2023-01-01 ENCOUNTER — Telehealth: Payer: Self-pay | Admitting: Family Medicine

## 2023-01-01 ENCOUNTER — Ambulatory Visit
Admission: RE | Admit: 2023-01-01 | Discharge: 2023-01-01 | Disposition: A | Discharge status: Home / Self Care | Payer: BC Managed Care – PPO | Source: Ambulatory Visit | Attending: Family Medicine | Admitting: Family Medicine

## 2023-01-01 LAB — CBC WITH DIFFERENTIAL/PLATELET
Basophils Absolute: 0.1 10*3/uL (ref 0.0–0.2)
Basos: 1 %
EOS (ABSOLUTE): 0.4 10*3/uL (ref 0.0–0.4)
Eos: 6 %
Hematocrit: 45.8 % (ref 37.5–51.0)
Hemoglobin: 15.9 g/dL (ref 13.0–17.7)
Immature Grans (Abs): 0.1 10*3/uL (ref 0.0–0.1)
Immature Granulocytes: 1 %
Lymphocytes Absolute: 1.6 10*3/uL (ref 0.7–3.1)
Lymphs: 26 %
MCH: 31.4 pg (ref 26.6–33.0)
MCHC: 34.7 g/dL (ref 31.5–35.7)
MCV: 91 fL (ref 79–97)
Monocytes Absolute: 0.6 10*3/uL (ref 0.1–0.9)
Monocytes: 10 %
Neutrophils Absolute: 3.5 10*3/uL (ref 1.4–7.0)
Neutrophils: 56 %
Platelets: 270 10*3/uL (ref 150–450)
RBC: 5.06 x10E6/uL (ref 4.14–5.80)
RDW: 13.1 % (ref 11.6–15.4)
WBC: 6.2 10*3/uL (ref 3.4–10.8)

## 2023-01-01 LAB — COMPREHENSIVE METABOLIC PANEL
ALT: 24 IU/L (ref 0–44)
AST: 20 IU/L (ref 0–40)
Albumin: 4.8 g/dL (ref 3.8–4.9)
Alkaline Phosphatase: 74 IU/L (ref 44–121)
BUN/Creatinine Ratio: 17 (ref 9–20)
BUN: 22 mg/dL (ref 6–24)
Bilirubin Total: 0.4 mg/dL (ref 0.0–1.2)
CO2: 20 mmol/L (ref 20–29)
Calcium: 9.7 mg/dL (ref 8.7–10.2)
Chloride: 107 mmol/L — ABNORMAL HIGH (ref 96–106)
Creatinine, Ser: 1.33 mg/dL — ABNORMAL HIGH (ref 0.76–1.27)
Globulin, Total: 2 g/dL (ref 1.5–4.5)
Glucose: 101 mg/dL — ABNORMAL HIGH (ref 70–99)
Potassium: 4.4 mmol/L (ref 3.5–5.2)
Sodium: 142 mmol/L (ref 134–144)
Total Protein: 6.8 g/dL (ref 6.0–8.5)
eGFR: 63 mL/min/{1.73_m2} (ref >59)

## 2023-01-01 LAB — TSH: TSH: 1.06 u[IU]/mL (ref 0.450–4.500)

## 2023-01-01 LAB — LIPID PANEL
Chol/HDL Ratio: 2.2 ratio (ref 0.0–5.0)
Cholesterol, Total: 107 mg/dL (ref 100–199)
HDL: 49 mg/dL (ref >39)
LDL Chol Calc (NIH): 36 mg/dL (ref 0–99)
Triglycerides: 121 mg/dL (ref 0–149)
VLDL Cholesterol Cal: 22 mg/dL (ref 5–40)

## 2023-01-01 NOTE — Telephone Encounter (Signed)
Disregard, previous message, he was able to schedule

## 2023-01-01 NOTE — Telephone Encounter (Signed)
Pt spouse called and states he was trying to call and set [name] appointment at [facility] imaging but he says they don't have the referral. Also since Lars Mage is not on his HIPAA form they wont let him schedule and [name] works somewhere with bad cell phone connection so he is asking if the nurse can schedule the appointment and call his spouse and let him know when the appointment is. Provided call back number [phone]

## 2023-01-07 ENCOUNTER — Telehealth: Payer: Self-pay | Admitting: Family Medicine

## 2023-01-07 DIAGNOSIS — R0602 Shortness of breath: Secondary | ICD-10-CM

## 2023-01-07 NOTE — Telephone Encounter (Signed)
Please call re x ray results  Not getting any better  Please call

## 2023-01-08 ENCOUNTER — Telehealth: Payer: Self-pay | Admitting: Family Medicine

## 2023-01-08 NOTE — Telephone Encounter (Signed)
Called spouse Lars Mage & informed

## 2023-01-08 NOTE — Telephone Encounter (Signed)
Spouse called hasn't heard anything back from [facility], said has been a week, I explained situation with [facility].  He wanted to know if you could do anything about the delay.  Said pt no better, sx about the same, no energy, left arm about same, no strength, still gasping for breath sometimes.  Please call & let them know something.

## 2023-01-15 ENCOUNTER — Ambulatory Visit: Payer: BC Managed Care – PPO | Attending: Internal Medicine | Admitting: Internal Medicine

## 2023-01-15 ENCOUNTER — Encounter: Payer: Self-pay | Admitting: Internal Medicine

## 2023-01-15 VITALS — BP 130/82 | HR 82 | Ht 66.0 in | Wt 204.2 lb

## 2023-01-15 DIAGNOSIS — R0609 Other forms of dyspnea: Secondary | ICD-10-CM | POA: Insufficient documentation

## 2023-01-15 DIAGNOSIS — I493 Ventricular premature depolarization: Secondary | ICD-10-CM

## 2023-01-15 DIAGNOSIS — B20 Human immunodeficiency virus [HIV] disease: Secondary | ICD-10-CM

## 2023-01-15 DIAGNOSIS — R079 Chest pain, unspecified: Secondary | ICD-10-CM | POA: Diagnosis not present

## 2023-01-15 MED ORDER — METOPROLOL TARTRATE 100 MG PO TABS: ORAL_TABLET | ORAL | 0 refills | Status: DC

## 2023-01-15 NOTE — Patient Instructions (Signed)
Medication Instructions:  Your physician recommends that you continue on your current medications as directed. Please refer to the Current Medication list given to you today.  *If you need a refill on your cardiac medications before your next appointment, please call your pharmacy*   Lab Work: NONE If you have labs (blood work) drawn today and your tests are completely normal, you will receive your results only by: MyChart Message (if you have MyChart) OR A paper copy in the mail If you have any lab test that is abnormal or we need to change your treatment, we will call you to review the results.   Testing/Procedures: Your physician has requested that you have cardiac CT. Cardiac computed tomography (CT) is a painless test that uses an x-ray machine to take clear, detailed pictures of your heart. For further information please visit [email or web address] Please follow instruction sheet as given.     Follow-Up: At [facility], you and your health needs are our priority.  As part of our continuing mission to provide you with exceptional heart care, we have created designated Provider Care Teams.  These Care Teams include your primary Cardiologist (physician) and Advanced Practice Providers (APPs -  Physician Assistants and Nurse Practitioners) who all work together to provide you with the care you need, when you need it.  We recommend signing up for the patient portal called "MyChart".  Sign up information is provided on this After Visit Summary.  MyChart is used to connect with patients for Virtual Visits (Telemedicine).  Patients are able to view lab/test results, encounter notes, upcoming appointments, etc.  Non-urgent messages can be sent to your provider as well.   To learn more about what you can do with MyChart, go to [email or web address].    Your next appointment:   3 month(s)  Provider:   Jari Favre, PA-C, Ronie Spies, PA-C, Robin Searing, NP, Jacolyn Reedy, PA-C,  Eligha Bridegroom, NP, Tereso Newcomer, PA-C, or Perlie Gold, PA-C      Other Instructions  Your cardiac CT will be scheduled at the below location:   [facility] [address] [place], [place] [address] [phone]    At [facility], please arrive at the [facility] and Children's Entrance (Entrance C2) of [facility] 30 minutes prior to test start time. You can use the FREE valet parking offered at entrance C (encouraged to control the heart rate for the test)  Proceed to the [facility] Radiology Department (first floor) to check-in and test prep.  All radiology patients and guests should use entrance C2 at [facility], accessed from [facility], even though the hospital's physical address listed is [address]     Please follow these instructions carefully (unless otherwise directed):  An IV will be required for this test and Nitroglycerin will be given.  Hold all erectile dysfunction medications at least 3 days (72 hrs) prior to test. (Ie viagra, cialis, sildenafil, tadalafil, etc)   On the Night Before the Test: Be sure to Drink plenty of water. Do not consume any caffeinated/decaffeinated beverages or chocolate 12 hours prior to your test. Do not take any antihistamines 12 hours prior to your test.   On the Day of the Test: Drink plenty of water until 1 hour prior to the test. Do not eat any food 1 hour prior to test. You may take your regular medications prior to the test.  Take metoprolol (Lopressor) 100 mg two hours prior to test.  If you take Furosemide/Hydrochlorothiazide/Spironolactone, please HOLD on the morning of the test.       After the Test: Drink plenty of water. After receiving IV contrast, you may experience a mild flushed feeling. This is normal. On occasion, you may experience a mild rash up to 24 hours after the test. This is not dangerous. If this occurs, you can take Benadryl 25 mg and  increase your fluid intake. If you experience trouble breathing, this can be serious. If it is severe call 911 IMMEDIATELY. If it is mild, please call our office. If you take any of these medications: Glipizide/Metformin, Avandament, Glucavance, please do not take 48 hours after completing test unless otherwise instructed.  We will call to schedule your test 2-4 weeks out understanding that some insurance companies will need an authorization prior to the service being performed.   For more information and frequently asked questions, please visit our website : [email or web address]  For non-scheduling related questions, please contact the cardiac imaging nurse navigator should you have any questions/concerns: Cardiac Imaging Nurse Navigators Direct Office Dial: [phone]   For scheduling needs, including cancellations and rescheduling, please call [place], [phone].
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
## 2023-01-21 ENCOUNTER — Telehealth (HOSPITAL_COMMUNITY): Payer: Self-pay | Admitting: *Deleted

## 2023-01-21 ENCOUNTER — Ambulatory Visit: Payer: BC Managed Care – PPO | Admitting: Infectious Disease

## 2023-01-21 ENCOUNTER — Telehealth: Payer: Self-pay

## 2023-01-21 DIAGNOSIS — B20 Human immunodeficiency virus [HIV] disease: Secondary | ICD-10-CM

## 2023-01-21 NOTE — Telephone Encounter (Signed)
Reaching out to patient to offer assistance regarding upcoming cardiac imaging study; pt verbalizes understanding of appt date/time, parking situation and where to check in, pre-test NPO status and medications ordered, and verified current allergies; name and call back number provided for further questions should they arise Rashiya Lofland RN Navigator Cardiac Imaging [facility] [phone] office [phone] cell  

## 2023-01-21 NOTE — Telephone Encounter (Signed)
Patient called to reschedule his follow up appointment for 09/23 with Dr. Daiva Eves due to work schedule. Is requesting Doxycycline after being discontinued, best contact number [phone]

## 2023-01-22 ENCOUNTER — Ambulatory Visit (HOSPITAL_COMMUNITY)
Admission: RE | Admit: 2023-01-22 | Discharge: 2023-01-22 | Disposition: A | Discharge status: Home / Self Care | Payer: BC Managed Care – PPO | Source: Ambulatory Visit | Attending: Internal Medicine | Admitting: Internal Medicine

## 2023-01-22 DIAGNOSIS — R079 Chest pain, unspecified: Secondary | ICD-10-CM | POA: Insufficient documentation

## 2023-01-22 DIAGNOSIS — I2510 Atherosclerotic heart disease of native coronary artery without angina pectoris: Secondary | ICD-10-CM

## 2023-01-22 MED ORDER — IOHEXOL 350 MG/ML SOLN
95.0000 mL | Freq: Once | INTRAVENOUS | Status: AC | PRN
  Administered 2023-01-22: 95 mL via INTRAVENOUS

## 2023-01-22 MED ORDER — NITROGLYCERIN 0.4 MG SL SUBL
0.8000 mg | SUBLINGUAL_TABLET | Freq: Once | SUBLINGUAL | Status: AC
  Administered 2023-01-22: 0.8 mg via SUBLINGUAL

## 2023-01-22 MED ORDER — NITROGLYCERIN 0.4 MG SL SUBL
SUBLINGUAL_TABLET | SUBLINGUAL | Status: AC
  Filled 2023-01-22: qty 2

## 2023-01-23 ENCOUNTER — Other Ambulatory Visit (HOSPITAL_COMMUNITY): Payer: Self-pay

## 2023-01-23 ENCOUNTER — Other Ambulatory Visit: Payer: Self-pay | Admitting: Infectious Disease

## 2023-01-23 DIAGNOSIS — G4700 Insomnia, unspecified: Secondary | ICD-10-CM

## 2023-01-26 ENCOUNTER — Ambulatory Visit (HOSPITAL_COMMUNITY): Payer: BC Managed Care – PPO

## 2023-01-27 ENCOUNTER — Other Ambulatory Visit: Payer: Self-pay

## 2023-01-27 ENCOUNTER — Other Ambulatory Visit (HOSPITAL_COMMUNITY): Payer: Self-pay

## 2023-01-27 ENCOUNTER — Telehealth: Payer: Self-pay | Admitting: Family Medicine

## 2023-01-27 MED ORDER — DOXYCYCLINE HYCLATE 100 MG PO TABS
ORAL_TABLET | ORAL | 1 refills | Status: DC
Start: 2023-01-27 — End: 2023-03-09
  Filled 2023-01-27: qty 60, 30d supply, fill #0
  Filled 2023-02-27: qty 60, 30d supply, fill #1

## 2023-01-27 NOTE — Telephone Encounter (Signed)
Pt wanting to know about CT scan results and what next step is and he is still having symptomas

## 2023-01-27 NOTE — Addendum Note (Signed)
Addended by: Juanita Laster on: 01/27/2023 08:03 AM   Modules accepted: Orders

## 2023-01-28 NOTE — Telephone Encounter (Signed)
Left message for pt to call.

## 2023-01-30 ENCOUNTER — Other Ambulatory Visit (HOSPITAL_COMMUNITY): Payer: Self-pay

## 2023-01-31 ENCOUNTER — Other Ambulatory Visit (HOSPITAL_COMMUNITY): Payer: Self-pay

## 2023-02-03 ENCOUNTER — Telehealth: Payer: Self-pay | Admitting: Family Medicine

## 2023-02-03 ENCOUNTER — Other Ambulatory Visit (HOSPITAL_COMMUNITY): Payer: Self-pay

## 2023-02-03 MED ORDER — TRAZODONE HCL 50 MG PO TABS
25.0000 mg | ORAL_TABLET | Freq: Every evening | ORAL | 3 refills | Status: DC | PRN
Start: 2023-02-03 — End: 2023-03-09
  Filled 2023-02-03: qty 30, 30d supply, fill #0
  Filled 2023-02-20: qty 30, 30d supply, fill #1

## 2023-02-03 NOTE — Telephone Encounter (Signed)
Pt is needing trazadone refilled, he is out and it helps him sleep. He uses [facility]

## 2023-02-04 ENCOUNTER — Ambulatory Visit: Payer: BC Managed Care – PPO | Admitting: Family Medicine

## 2023-02-04 ENCOUNTER — Other Ambulatory Visit (HOSPITAL_COMMUNITY): Payer: Self-pay

## 2023-02-04 ENCOUNTER — Other Ambulatory Visit: Payer: Self-pay

## 2023-02-04 VITALS — BP 130/86 | HR 69 | Temp 97.7°F | Resp 16 | Wt 211.0 lb

## 2023-02-04 DIAGNOSIS — R0602 Shortness of breath: Secondary | ICD-10-CM

## 2023-02-04 DIAGNOSIS — R5383 Other fatigue: Secondary | ICD-10-CM

## 2023-02-04 NOTE — Progress Notes (Signed)
   Subjective:    Patient ID: Edward Gillespie, male    DOB: Nov 28, 1965, 57 y.o.   MRN: [id number]  HPI He is here for recheck.  He does have an interpreter, Brynda Greathouse [phone].  He is still having difficulty with shortness of breath, fatigue and palpitations.  He did get a cardiology evaluation and that was reviewed.  Cardiac evaluation showed no major concerns.  His coronary calcium score was 55.   Review of Systems     Objective:    Physical Exam Alert and in no distress.  Cardiac exam shows regular rhythm without murmurs or gallops.  Lungs are clear to auscultation.  Cardiology evaluation was reviewed including the coronary calcium score.       Assessment & Plan:  Shortness of breath - Plan: Ambulatory referral to Pulmonology  Other fatigue - Plan: Ambulatory referral to Pulmonology I reviewed the note from cardiology with him explaining that the PVCs were of no major concern.  I also discussed the coronary calcium score and the fact that it is a low risk for any major concern.  I reviewed the lab data with him explaining that we saw no major issues that could cause his shortness of breath or fatigue.  I will refer to pulmonology to further assess his shortness of breath and fatigue.

## 2023-02-09 ENCOUNTER — Encounter: Payer: BC Managed Care – PPO | Admitting: Family Medicine

## 2023-02-18 ENCOUNTER — Encounter: Payer: BC Managed Care – PPO | Admitting: Family Medicine

## 2023-02-18 ENCOUNTER — Ambulatory Visit: Payer: BC Managed Care – PPO | Admitting: Infectious Disease

## 2023-02-20 ENCOUNTER — Other Ambulatory Visit (HOSPITAL_COMMUNITY): Payer: Self-pay

## 2023-02-27 ENCOUNTER — Other Ambulatory Visit: Payer: Self-pay

## 2023-03-09 ENCOUNTER — Other Ambulatory Visit: Payer: Self-pay

## 2023-03-09 ENCOUNTER — Encounter: Payer: Self-pay | Admitting: Infectious Disease

## 2023-03-09 ENCOUNTER — Other Ambulatory Visit (HOSPITAL_COMMUNITY)
Admission: RE | Admit: 2023-03-09 | Discharge: 2023-03-09 | Disposition: A | Discharge status: Home / Self Care | Payer: BC Managed Care – PPO | Source: Ambulatory Visit | Attending: Infectious Disease | Admitting: Infectious Disease

## 2023-03-09 ENCOUNTER — Ambulatory Visit (INDEPENDENT_AMBULATORY_CARE_PROVIDER_SITE_OTHER): Payer: BC Managed Care – PPO | Admitting: Infectious Disease

## 2023-03-09 VITALS — BP 131/88 | HR 85 | Resp 16 | Ht 66.0 in | Wt 214.0 lb

## 2023-03-09 DIAGNOSIS — B20 Human immunodeficiency virus [HIV] disease: Secondary | ICD-10-CM

## 2023-03-09 DIAGNOSIS — Z7184 Encounter for health counseling related to travel: Secondary | ICD-10-CM | POA: Diagnosis present

## 2023-03-09 DIAGNOSIS — M542 Cervicalgia: Secondary | ICD-10-CM | POA: Insufficient documentation

## 2023-03-09 DIAGNOSIS — Z23 Encounter for immunization: Secondary | ICD-10-CM | POA: Insufficient documentation

## 2023-03-09 DIAGNOSIS — E785 Hyperlipidemia, unspecified: Secondary | ICD-10-CM | POA: Insufficient documentation

## 2023-03-09 DIAGNOSIS — Z21 Asymptomatic human immunodeficiency virus [HIV] infection status: Secondary | ICD-10-CM

## 2023-03-09 DIAGNOSIS — K6282 Dysplasia of anus: Secondary | ICD-10-CM | POA: Insufficient documentation

## 2023-03-09 DIAGNOSIS — Z7185 Encounter for immunization safety counseling: Secondary | ICD-10-CM

## 2023-03-09 DIAGNOSIS — R202 Paresthesia of skin: Secondary | ICD-10-CM

## 2023-03-09 DIAGNOSIS — Z113 Encounter for screening for infections with a predominantly sexual mode of transmission: Secondary | ICD-10-CM

## 2023-03-09 MED ORDER — TRAZODONE HCL 50 MG PO TABS
25.0000 mg | ORAL_TABLET | Freq: Every evening | ORAL | 11 refills | Status: DC | PRN
  Filled 2023-03-09 – 2023-03-28 (×4): qty 30, 30d supply, fill #0
  Filled 2023-04-24: qty 30, 30d supply, fill #1
  Filled 2023-05-27: qty 30, 30d supply, fill #2
  Filled 2023-06-25: qty 30, 30d supply, fill #3
  Filled 2023-07-25 – 2023-07-28 (×2): qty 30, 30d supply, fill #4
  Filled 2023-08-23 – 2023-08-25 (×3): qty 30, 30d supply, fill #5
  Filled 2023-09-21 – 2023-09-23 (×2): qty 30, 30d supply, fill #6
  Filled 2023-10-20 – 2023-10-23 (×2): qty 30, 30d supply, fill #7
  Filled 2023-11-18: qty 30, 30d supply, fill #8
  Filled 2023-12-26: qty 30, 30d supply, fill #9
  Filled 2024-01-21: qty 30, 30d supply, fill #10
  Filled 2024-02-12: qty 30, 30d supply, fill #11

## 2023-03-09 MED ORDER — DOXYCYCLINE HYCLATE 100 MG PO TABS
ORAL_TABLET | ORAL | 5 refills | Status: DC
  Filled 2023-03-09: qty 60, fill #0
  Filled 2023-07-25: qty 60, 30d supply, fill #0
  Filled 2023-08-23: qty 60, 30d supply, fill #1
  Filled 2023-11-27: qty 60, 30d supply, fill #2

## 2023-03-09 MED ORDER — ATORVASTATIN CALCIUM 40 MG PO TABS
40.0000 mg | ORAL_TABLET | Freq: Every day | ORAL | 11 refills | Status: DC
  Filled 2023-03-09 – 2023-03-27 (×3): qty 30, 30d supply, fill #0
  Filled 2023-04-24: qty 30, 30d supply, fill #1
  Filled 2023-05-28: qty 30, 30d supply, fill #2
  Filled 2023-06-26: qty 30, 30d supply, fill #3
  Filled 2023-07-25: qty 30, 30d supply, fill #4
  Filled 2023-08-23: qty 30, 30d supply, fill #5
  Filled 2023-09-21: qty 30, 30d supply, fill #6
  Filled 2023-10-20 – 2023-10-23 (×2): qty 30, 30d supply, fill #7

## 2023-03-09 MED ORDER — BICTEGRAVIR-EMTRICITAB-TENOFOV 50-200-25 MG PO TABS
1.0000 | ORAL_TABLET | Freq: Every day | ORAL | 11 refills | Status: DC
  Filled 2023-03-09 – 2023-03-25 (×3): qty 30, 30d supply, fill #0
  Filled 2023-04-24: qty 30, 30d supply, fill #1
  Filled 2023-05-27: qty 30, 30d supply, fill #2
  Filled 2023-06-25: qty 30, 30d supply, fill #3
  Filled 2023-07-25 – 2023-07-30 (×4): qty 30, 30d supply, fill #4
  Filled 2023-08-23 – 2023-08-26 (×2): qty 30, 30d supply, fill #5
  Filled 2023-09-21 – 2023-09-23 (×2): qty 30, 30d supply, fill #6
  Filled 2023-10-21: qty 30, 30d supply, fill #7

## 2023-03-09 NOTE — Patient Instructions (Addendum)
Vaccines to consider before travel  Chikungunya  Typhoid Vaccine  Yellow Fever is probably not necessary but  Regardless I would strongly recommend going to the Health Department Travel Medicine clinic prior to travel
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
## 2023-03-10 ENCOUNTER — Other Ambulatory Visit (HOSPITAL_COMMUNITY): Payer: Self-pay

## 2023-03-10 ENCOUNTER — Other Ambulatory Visit: Payer: Self-pay

## 2023-03-10 LAB — T-HELPER CELLS (CD4) COUNT (NOT AT ARMC)
CD4 % Helper T Cell: 38 % (ref 33–65)
CD4 T Cell Abs: 511 /uL (ref 400–1790)

## 2023-03-11 LAB — URINE CYTOLOGY ANCILLARY ONLY
Chlamydia: NEGATIVE
Comment: NEGATIVE
Comment: NORMAL
Neisseria Gonorrhea: NEGATIVE

## 2023-03-11 LAB — LIPID PANEL
Cholesterol: 125 mg/dL (ref <200)
HDL: 50 mg/dL (ref >=40)
LDL Cholesterol (Calc): 55 mg/dL (calc)
Non-HDL Cholesterol (Calc): 75 mg/dL (calc) (ref <130)
Total CHOL/HDL Ratio: 2.5 (calc) (ref <5.0)
Triglycerides: 118 mg/dL (ref <150)

## 2023-03-11 LAB — COMPLETE METABOLIC PANEL WITH GFR
AG Ratio: 2 (calc) (ref 1.0–2.5)
ALT: 26 U/L (ref 9–46)
AST: 22 U/L (ref 10–35)
Albumin: 5.1 g/dL (ref 3.6–5.1)
Alkaline phosphatase (APISO): 78 U/L (ref 35–144)
BUN/Creatinine Ratio: 22 (calc) (ref 6–22)
BUN: 26 mg/dL — ABNORMAL HIGH (ref 7–25)
CO2: 24 mmol/L (ref 20–32)
Calcium: 10 mg/dL (ref 8.6–10.3)
Chloride: 105 mmol/L (ref 98–110)
Creat: 1.16 mg/dL (ref 0.70–1.30)
Globulin: 2.5 g/dL (calc) (ref 1.9–3.7)
Glucose, Bld: 84 mg/dL (ref 65–99)
Potassium: 4.4 mmol/L (ref 3.5–5.3)
Sodium: 140 mmol/L (ref 135–146)
Total Bilirubin: 0.5 mg/dL (ref 0.2–1.2)
Total Protein: 7.6 g/dL (ref 6.1–8.1)
eGFR: 74 mL/min/{1.73_m2} (ref >=60)

## 2023-03-11 LAB — CBC WITH DIFFERENTIAL/PLATELET
Absolute Monocytes: 536 cells/uL (ref 200–950)
Basophils Absolute: 51 cells/uL (ref 0–200)
Basophils Relative: 0.9 %
Eosinophils Absolute: 257 cells/uL (ref 15–500)
Eosinophils Relative: 4.5 %
HCT: 47.9 % (ref 38.5–50.0)
Hemoglobin: 16.4 g/dL (ref 13.2–17.1)
Lymphs Abs: 1493 cells/uL (ref 850–3900)
MCH: 30.8 pg (ref 27.0–33.0)
MCHC: 34.2 g/dL (ref 32.0–36.0)
MCV: 89.9 fL (ref 80.0–100.0)
MPV: 10.2 fL (ref 7.5–12.5)
Monocytes Relative: 9.4 %
Neutro Abs: 3363 cells/uL (ref 1500–7800)
Neutrophils Relative %: 59 %
Platelets: 256 10*3/uL (ref 140–400)
RBC: 5.33 10*6/uL (ref 4.20–5.80)
RDW: 12.3 % (ref 11.0–15.0)
Total Lymphocyte: 26.2 %
WBC: 5.7 10*3/uL (ref 3.8–10.8)

## 2023-03-11 LAB — CYTOLOGY, (ORAL, ANAL, URETHRAL) ANCILLARY ONLY
Chlamydia: NEGATIVE
Chlamydia: NEGATIVE
Comment: NEGATIVE
Comment: NEGATIVE
Comment: NORMAL
Comment: NORMAL
Neisseria Gonorrhea: NEGATIVE
Neisseria Gonorrhea: NEGATIVE

## 2023-03-11 LAB — RPR: RPR Ser Ql: NONREACTIVE

## 2023-03-11 LAB — HIV-1 RNA QUANT-NO REFLEX-BLD
HIV 1 RNA Quant: NOT DETECTED Copies/mL
HIV-1 RNA Quant, Log: NOT DETECTED Log cps/mL

## 2023-03-13 LAB — CYTOLOGY - PAP
Comment: NEGATIVE
Diagnosis: UNDETERMINED — AB
High risk HPV: NEGATIVE

## 2023-03-14 ENCOUNTER — Other Ambulatory Visit: Payer: Self-pay | Admitting: Infectious Disease

## 2023-03-14 DIAGNOSIS — B20 Human immunodeficiency virus [HIV] disease: Secondary | ICD-10-CM

## 2023-03-14 DIAGNOSIS — R85610 Atypical squamous cells of undetermined significance on cytologic smear of anus (ASC-US): Secondary | ICD-10-CM

## 2023-03-16 ENCOUNTER — Telehealth: Payer: Self-pay

## 2023-03-16 NOTE — Telephone Encounter (Signed)
-----   Message from [facility] sent at 03/14/2023  2:11 PM EDT ----- Ascus found on pap smear, referred to Romie Levee ----- Message ----- From: Janace Hoard Lab Results In Sent: 03/09/2023  10:59 PM EDT To: Randall Hiss, MD

## 2023-03-16 NOTE — Telephone Encounter (Signed)
Attempted to call patient with assistance from pacific interpreters regarding results. Left voicemail requesting call back. Jose [id number] Juanita Laster, RMA

## 2023-03-18 ENCOUNTER — Other Ambulatory Visit: Payer: Self-pay

## 2023-03-23 ENCOUNTER — Other Ambulatory Visit (HOSPITAL_COMMUNITY): Payer: Self-pay

## 2023-03-24 ENCOUNTER — Other Ambulatory Visit (HOSPITAL_COMMUNITY): Payer: Self-pay

## 2023-03-25 ENCOUNTER — Other Ambulatory Visit: Payer: Self-pay

## 2023-03-25 ENCOUNTER — Other Ambulatory Visit (HOSPITAL_COMMUNITY): Payer: Self-pay

## 2023-03-25 NOTE — Progress Notes (Signed)
Specialty Pharmacy Refill Coordination Note  Edward Gillespie is a 57 y.o. male contacted today regarding refills of specialty medication(s) Bictegravir-Emtricitab-Tenofov   Patient requested Delivery   Delivery date: 04/07/23   Verified address: [address] [facility]   Medication will be filled on 04/06/23.

## 2023-03-27 ENCOUNTER — Other Ambulatory Visit: Payer: Self-pay

## 2023-03-28 ENCOUNTER — Other Ambulatory Visit (HOSPITAL_COMMUNITY): Payer: Self-pay

## 2023-03-30 ENCOUNTER — Other Ambulatory Visit (HOSPITAL_COMMUNITY): Payer: Self-pay

## 2023-04-06 ENCOUNTER — Other Ambulatory Visit: Payer: Self-pay

## 2023-04-17 ENCOUNTER — Ambulatory Visit: Payer: BC Managed Care – PPO | Attending: Physician Assistant | Admitting: Physician Assistant

## 2023-04-17 NOTE — Progress Notes (Deleted)
  Cardiology Office Note:  .   Date:  04/17/2023  ID:  Edward Gillespie, DOB [birth year], MRN [id number] PCP: [facility]  [facility] Providers Cardiologist:  Christell Constant, MD {  History of Present Illness: .   Edward Gillespie is a 57 y.o. male history of HTN, on Biktarvy, and syncope (03/07/20) here for routine follow-up visit.  As a part of syncopal warm valve, echocardiogram was ordered 03/22/2020 which was normal and Zio patch was completed 04/16/2020 which was notable for occasional PVCs and an asymptomatic 4 beat run of NSVT, triggered events for sinus rhythm and sinus tachycardia.  Insurance unfortunately would not cover CCTA.  Patient noted he was doing okay on his last visit 01/2023.  Statin therapy changes due to concerns for symptoms and transaminitis  He did not endorse any a constellation of symptoms which includes excessive sweating with activity, associated with fatigue worsening with activity.  Works in a factory and likes a lot.  With activity such as sweeping and has chest pain that radiates down his left arm.  Feels like numbness.  Also noted his fingernails turn blue.  Weakness from the knees down.  Today, he**  ROS: ***  Studies Reviewed: .        *** Risk Assessment/Calculations:   {Does this patient have ATRIAL FIBRILLATION?:[phone]} No BP recorded.  {Refresh Note OR Click here to enter BP  :1}***       Physical Exam:   VS:  There were no vitals taken for this visit.   Wt Readings from Last 3 Encounters:  03/09/23 214 lb (97.1 kg)  02/04/23 211 lb (95.7 kg)  01/15/23 204 lb 3.2 oz (92.6 kg)    GEN: Well nourished, well developed in no acute distress NECK: No JVD; No carotid bruits CARDIAC: ***RRR, no murmurs, rubs, gallops RESPIRATORY:  Clear to auscultation without rales, wheezing or rhonchi  ABDOMEN: Soft, non-tender, non-distended EXTREMITIES:  No edema; No deformity   ASSESSMENT AND PLAN: .    ***    {Are you ordering a CV Procedure (e.g. stress test, cath, DCCV, TEE, etc)?   Press F2        :[id number]}  Dispo: ***  Signed, Sharlene Dory, PA-C

## 2023-04-23 ENCOUNTER — Other Ambulatory Visit: Payer: Self-pay

## 2023-04-24 ENCOUNTER — Other Ambulatory Visit: Payer: Self-pay

## 2023-04-24 NOTE — Progress Notes (Signed)
Specialty Pharmacy Refill Coordination Note  F-X Edward Gillespie is a 57 y.o. male contacted today regarding refills of specialty medication(s) Bictegravir-Emtricitab-Tenofov Spoke with patient's husband.  Patient requested Delivery   Delivery date: 05/04/23   Verified address: Patient address [address] [facility] [address]   Medication will be filled on 05/01/23.

## 2023-04-30 ENCOUNTER — Other Ambulatory Visit (HOSPITAL_COMMUNITY): Payer: Self-pay

## 2023-05-01 ENCOUNTER — Other Ambulatory Visit: Payer: Self-pay

## 2023-05-22 ENCOUNTER — Other Ambulatory Visit: Payer: Self-pay

## 2023-05-25 ENCOUNTER — Other Ambulatory Visit: Payer: Self-pay | Admitting: Pharmacist

## 2023-05-25 NOTE — Progress Notes (Signed)
Specialty Pharmacy Ongoing Clinical Assessment Note  Edward Gillespie is a 57 y.o. male who is being followed by the specialty pharmacy service for RxSp HIV   Patient's specialty medication(s) reviewed today: Bictegravir-Emtricitab-Tenofov   Missed doses in the last 4 weeks: 0   Patient/Caregiver did not have any additional questions or concerns.   Therapeutic benefit summary: Patient is achieving benefit   Adverse events/side effects summary: No adverse events/side effects   Patient's therapy is appropriate to: Continue    Goals Addressed             This Visit's Progress    Achieve Undetectable HIV Viral Load < 20       Patient is on track. Patient will maintain adherence.      Comply with lab assessments       Patient is on track. Patient will adhere to provider and/or lab appointments.      Minimize and address adverse drug events/drug interactions       Patient is on track. Patient will be monitored by provider to determine if a change in treatment plan is warranted.         Follow up:  6 months  Ulyess Muto L. Jannette Fogo, PharmD, BCIDP, AAHIVP, CPP Clinical Pharmacist Practitioner Infectious Diseases Clinical Pharmacist [facility] 05/25/2023, 11:05 AM

## 2023-05-27 ENCOUNTER — Other Ambulatory Visit: Payer: Self-pay

## 2023-05-27 NOTE — Progress Notes (Signed)
Specialty Pharmacy Refill Coordination Note  Edward Gillespie is a 57 y.o. male contacted today regarding refills of specialty medication(s) Bictegravir-Emtricitab-Tenofov   Patient requested Delivery   Delivery date: 06/03/23   Verified address: Patient address [address] [facility] [address]   Medication will be filled on 06/02/23.   Also shipping trazodone.

## 2023-05-28 ENCOUNTER — Other Ambulatory Visit: Payer: Self-pay

## 2023-05-29 ENCOUNTER — Other Ambulatory Visit: Payer: Self-pay

## 2023-06-02 ENCOUNTER — Other Ambulatory Visit: Payer: Self-pay

## 2023-06-25 ENCOUNTER — Other Ambulatory Visit (HOSPITAL_COMMUNITY): Payer: Self-pay | Admitting: Pharmacy Technician

## 2023-06-25 ENCOUNTER — Other Ambulatory Visit (HOSPITAL_COMMUNITY): Payer: Self-pay

## 2023-06-25 NOTE — Progress Notes (Signed)
 Specialty Pharmacy Refill Coordination Note  Edward Gillespie is a 58 y.o. male contacted today regarding refills of specialty medication(s) Bictegravir-Emtricitab-Tenofov (BIKTARVY )  Spoke with Spouse  Patient requested Delivery   Delivery date: 07/03/23   Verified address: Patient address [address]  [place]   Medication will be filled on 07/02/23.

## 2023-06-27 ENCOUNTER — Other Ambulatory Visit (HOSPITAL_COMMUNITY): Payer: Self-pay

## 2023-07-13 ENCOUNTER — Other Ambulatory Visit (HOSPITAL_COMMUNITY): Payer: Self-pay

## 2023-07-25 ENCOUNTER — Other Ambulatory Visit (HOSPITAL_COMMUNITY): Payer: Self-pay

## 2023-07-26 ENCOUNTER — Other Ambulatory Visit: Payer: Self-pay

## 2023-07-28 ENCOUNTER — Other Ambulatory Visit (HOSPITAL_COMMUNITY): Payer: Self-pay

## 2023-07-28 ENCOUNTER — Other Ambulatory Visit: Payer: Self-pay

## 2023-07-29 ENCOUNTER — Other Ambulatory Visit: Payer: Self-pay

## 2023-07-30 ENCOUNTER — Other Ambulatory Visit: Payer: Self-pay

## 2023-07-30 ENCOUNTER — Other Ambulatory Visit (HOSPITAL_COMMUNITY): Payer: Self-pay

## 2023-07-30 NOTE — Progress Notes (Signed)
Specialty Pharmacy Refill Coordination Note  Edward Gillespie is a 58 y.o. male contacted today regarding refills of specialty medication(s) Bictegravir-Emtricitab-Tenofov [name])   Patient requested Delivery   Delivery date: 07/31/23   Verified address: [address]   [place] [id number]   Medication will be filled on 07/30/23.

## 2023-08-07 ENCOUNTER — Other Ambulatory Visit (HOSPITAL_COMMUNITY): Payer: Self-pay

## 2023-08-24 ENCOUNTER — Other Ambulatory Visit (HOSPITAL_COMMUNITY): Payer: Self-pay

## 2023-08-24 ENCOUNTER — Other Ambulatory Visit: Payer: Self-pay

## 2023-08-25 ENCOUNTER — Other Ambulatory Visit (HOSPITAL_COMMUNITY): Payer: Self-pay

## 2023-08-26 ENCOUNTER — Other Ambulatory Visit: Payer: Self-pay

## 2023-08-26 NOTE — Progress Notes (Signed)
 Specialty Pharmacy Refill Coordination Note  F-X Jeananne Rama is a 58 y.o. male contacted today regarding refills of specialty medication(s) Bictegravir-Emtricitab-Tenofov (BIKTARVY)  Spoke with patient's husband Lars Mage  Patient requested Delivery   Delivery date: 09/01/23   Verified address: [address]   [place] [phone]   Medication will be filled on 03.17.25.

## 2023-08-31 ENCOUNTER — Other Ambulatory Visit: Payer: Self-pay

## 2023-08-31 ENCOUNTER — Ambulatory Visit: Payer: BC Managed Care – PPO | Admitting: Infectious Disease

## 2023-09-03 ENCOUNTER — Other Ambulatory Visit (HOSPITAL_COMMUNITY): Payer: Self-pay

## 2023-09-12 ENCOUNTER — Encounter: Payer: Self-pay | Admitting: Infectious Disease

## 2023-09-15 ENCOUNTER — Ambulatory Visit: Payer: Self-pay

## 2023-09-15 ENCOUNTER — Other Ambulatory Visit: Payer: Self-pay | Admitting: Student

## 2023-09-15 DIAGNOSIS — M25532 Pain in left wrist: Secondary | ICD-10-CM

## 2023-09-22 ENCOUNTER — Ambulatory Visit: Payer: Self-pay

## 2023-09-22 ENCOUNTER — Other Ambulatory Visit (HOSPITAL_COMMUNITY): Payer: Self-pay

## 2023-09-22 ENCOUNTER — Other Ambulatory Visit: Payer: Self-pay

## 2023-09-22 ENCOUNTER — Other Ambulatory Visit: Payer: Self-pay | Admitting: Student

## 2023-09-22 DIAGNOSIS — M79642 Pain in left hand: Secondary | ICD-10-CM

## 2023-09-23 ENCOUNTER — Other Ambulatory Visit: Payer: Self-pay

## 2023-09-23 ENCOUNTER — Other Ambulatory Visit: Payer: Self-pay | Admitting: Pharmacy Technician

## 2023-09-23 NOTE — Progress Notes (Signed)
 Specialty Pharmacy Refill Coordination Note  F-X Edward Gillespie is a 58 y.o. male contacted today regarding refills of specialty medication(s) Bictegravir-Emtricitab-Tenofov [facility])   Patient requested (Patient-Rptd) Delivery   Delivery date: (Patient-Rptd) 09/25/23   Verified address: (Patient-Rptd) [address] [place], [place] [address]   Medication will be filled on 09/24/23.

## 2023-09-24 ENCOUNTER — Other Ambulatory Visit: Payer: Self-pay

## 2023-09-24 NOTE — Progress Notes (Signed)
 LVM for patient about new shipping date. Due to ins. paying on 4/12 but its a Saturday Next day this could go out will be Monday 4/14 for 4/15.

## 2023-10-20 ENCOUNTER — Other Ambulatory Visit (HOSPITAL_COMMUNITY): Payer: Self-pay

## 2023-10-21 ENCOUNTER — Other Ambulatory Visit: Payer: Self-pay

## 2023-10-21 ENCOUNTER — Other Ambulatory Visit: Payer: Self-pay | Admitting: Pharmacy Technician

## 2023-10-21 NOTE — Progress Notes (Signed)
 Specialty Pharmacy Refill Coordination Note  Edward Gillespie is a 58 y.o. male contacted today regarding refills of specialty medication(s) Bictegravir-Emtricitab-Tenofov (BIKTARVY )   Patient requested Delivery   Delivery date: 10/28/23   Verified address: [address] [place], [place], [place] [address]   Medication will be filled on 10/27/23.

## 2023-10-26 NOTE — Progress Notes (Signed)
 The ASCVD Risk score (Arnett DK, et al., 2019) failed to calculate for the following reasons:   The valid total cholesterol range is 130 to 320 mg/dL  Edward Gillespie, BSN, RN

## 2023-10-28 ENCOUNTER — Other Ambulatory Visit: Payer: Self-pay

## 2023-10-28 ENCOUNTER — Emergency Department (HOSPITAL_COMMUNITY)
Admission: EM | Admit: 2023-10-28 | Discharge: 2023-10-28 | Disposition: A | Discharge status: Home / Self Care | Payer: Worker's Compensation | Attending: Emergency Medicine | Admitting: Emergency Medicine

## 2023-10-28 ENCOUNTER — Other Ambulatory Visit (HOSPITAL_COMMUNITY): Payer: Self-pay

## 2023-10-28 ENCOUNTER — Encounter (HOSPITAL_COMMUNITY): Payer: Self-pay

## 2023-10-28 DIAGNOSIS — T24111A Burn of first degree of right thigh, initial encounter: Secondary | ICD-10-CM | POA: Diagnosis not present

## 2023-10-28 DIAGNOSIS — T2122XA Burn of second degree of abdominal wall, initial encounter: Secondary | ICD-10-CM | POA: Diagnosis not present

## 2023-10-28 DIAGNOSIS — Y990 Civilian activity done for income or pay: Secondary | ICD-10-CM | POA: Diagnosis not present

## 2023-10-28 DIAGNOSIS — Z23 Encounter for immunization: Secondary | ICD-10-CM | POA: Insufficient documentation

## 2023-10-28 DIAGNOSIS — I10 Essential (primary) hypertension: Secondary | ICD-10-CM | POA: Diagnosis not present

## 2023-10-28 DIAGNOSIS — X111XXA Contact with running hot water, initial encounter: Secondary | ICD-10-CM | POA: Insufficient documentation

## 2023-10-28 DIAGNOSIS — T3110 Burns involving 10-19% of body surface with 0% to 9% third degree burns: Secondary | ICD-10-CM | POA: Insufficient documentation

## 2023-10-28 DIAGNOSIS — T24101A Burn of first degree of unspecified site of right lower limb, except ankle and foot, initial encounter: Secondary | ICD-10-CM

## 2023-10-28 DIAGNOSIS — T2102XA Burn of unspecified degree of abdominal wall, initial encounter: Secondary | ICD-10-CM | POA: Diagnosis present

## 2023-10-28 DIAGNOSIS — T2112XA Burn of first degree of abdominal wall, initial encounter: Secondary | ICD-10-CM

## 2023-10-28 MED ORDER — TETANUS-DIPHTH-ACELL PERTUSSIS 5-2.5-18.5 LF-MCG/0.5 IM SUSY
0.5000 mL | PREFILLED_SYRINGE | Freq: Once | INTRAMUSCULAR | Status: AC
  Administered 2023-10-28: 0.5 mL via INTRAMUSCULAR
  Filled 2023-10-28: qty 0.5

## 2023-10-28 MED ORDER — OXYCODONE-ACETAMINOPHEN 5-325 MG PO TABS
1.0000 | ORAL_TABLET | ORAL | 0 refills | Status: AC | PRN
  Filled 2023-10-28 (×2): qty 15, 3d supply, fill #0

## 2023-10-28 MED ORDER — ONDANSETRON 4 MG PO TBDP
4.0000 mg | ORAL_TABLET | Freq: Once | ORAL | Status: AC
  Administered 2023-10-28: 4 mg via ORAL
  Filled 2023-10-28: qty 1

## 2023-10-28 MED ORDER — KETOROLAC TROMETHAMINE 15 MG/ML IJ SOLN
15.0000 mg | Freq: Once | INTRAMUSCULAR | Status: AC
  Administered 2023-10-28: 15 mg via INTRAMUSCULAR
  Filled 2023-10-28: qty 1

## 2023-10-28 MED ORDER — BACITRACIN ZINC 500 UNIT/GM EX OINT
TOPICAL_OINTMENT | Freq: Two times a day (BID) | CUTANEOUS | Status: DC
  Filled 2023-10-28: qty 4.5

## 2023-10-28 MED ORDER — HYDROMORPHONE HCL 1 MG/ML IJ SOLN
1.0000 mg | Freq: Once | INTRAMUSCULAR | Status: AC
  Administered 2023-10-28: 1 mg via SUBCUTANEOUS
  Filled 2023-10-28: qty 1

## 2023-10-28 NOTE — ED Notes (Signed)
 Pain completely resolved after pain meds administered. No complaints at this time. Pt resting comfortably.
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
## 2023-11-16 ENCOUNTER — Other Ambulatory Visit: Payer: Self-pay

## 2023-11-16 ENCOUNTER — Encounter: Payer: Self-pay | Admitting: Infectious Disease

## 2023-11-16 ENCOUNTER — Other Ambulatory Visit (HOSPITAL_COMMUNITY): Payer: Self-pay

## 2023-11-16 ENCOUNTER — Other Ambulatory Visit (HOSPITAL_COMMUNITY)
Admission: RE | Admit: 2023-11-16 | Discharge: 2023-11-16 | Disposition: A | Discharge status: Home / Self Care | Source: Ambulatory Visit | Attending: Infectious Disease | Admitting: Infectious Disease

## 2023-11-16 ENCOUNTER — Ambulatory Visit: Admitting: Infectious Disease

## 2023-11-16 VITALS — BP 122/76 | HR 68 | Resp 16 | Ht 68.5 in | Wt 189.0 lb

## 2023-11-16 DIAGNOSIS — I10 Essential (primary) hypertension: Secondary | ICD-10-CM

## 2023-11-16 DIAGNOSIS — I2583 Coronary atherosclerosis due to lipid rich plaque: Secondary | ICD-10-CM

## 2023-11-16 DIAGNOSIS — B20 Human immunodeficiency virus [HIV] disease: Secondary | ICD-10-CM | POA: Diagnosis not present

## 2023-11-16 DIAGNOSIS — E785 Hyperlipidemia, unspecified: Secondary | ICD-10-CM

## 2023-11-16 DIAGNOSIS — I2510 Atherosclerotic heart disease of native coronary artery without angina pectoris: Secondary | ICD-10-CM

## 2023-11-16 DIAGNOSIS — G4733 Obstructive sleep apnea (adult) (pediatric): Secondary | ICD-10-CM

## 2023-11-16 DIAGNOSIS — Z113 Encounter for screening for infections with a predominantly sexual mode of transmission: Secondary | ICD-10-CM | POA: Diagnosis not present

## 2023-11-16 DIAGNOSIS — R85610 Atypical squamous cells of undetermined significance on cytologic smear of anus (ASC-US): Secondary | ICD-10-CM | POA: Diagnosis not present

## 2023-11-16 MED ORDER — ATORVASTATIN CALCIUM 40 MG PO TABS
40.0000 mg | ORAL_TABLET | Freq: Every day | ORAL | 11 refills | Status: DC
  Filled 2023-11-16 – 2023-11-20 (×2): qty 30, 30d supply, fill #0
  Filled 2023-12-26: qty 30, 30d supply, fill #1
  Filled 2024-01-21: qty 30, 30d supply, fill #2
  Filled 2024-02-12: qty 30, 30d supply, fill #3
  Filled 2024-03-07 – 2024-05-30 (×3): qty 30, 30d supply, fill #4

## 2023-11-16 MED ORDER — BICTEGRAVIR-EMTRICITAB-TENOFOV 50-200-25 MG PO TABS
1.0000 | ORAL_TABLET | Freq: Every day | ORAL | 11 refills | Status: DC
  Filled 2023-11-16 – 2023-11-18 (×2): qty 30, 30d supply, fill #0
  Filled 2023-12-16: qty 30, 30d supply, fill #1
  Filled 2024-01-11 – 2024-01-21 (×4): qty 30, 30d supply, fill #2
  Filled 2024-02-12 – 2024-03-07 (×4): qty 30, 30d supply, fill #3
  Filled 2024-04-04 – 2024-04-21 (×3): qty 30, 30d supply, fill #4

## 2023-11-17 ENCOUNTER — Other Ambulatory Visit (HOSPITAL_COMMUNITY): Payer: Self-pay

## 2023-11-17 LAB — T-HELPER CELLS (CD4) COUNT (NOT AT ARMC)
CD4 % Helper T Cell: 38 % (ref 33–65)
CD4 T Cell Abs: 682 /uL (ref 400–1790)

## 2023-11-18 ENCOUNTER — Other Ambulatory Visit: Payer: Self-pay

## 2023-11-18 LAB — COMPLETE METABOLIC PANEL WITHOUT GFR
AG Ratio: 2.3 (calc) (ref 1.0–2.5)
ALT: 28 U/L (ref 9–46)
AST: 16 U/L (ref 10–35)
Albumin: 4.9 g/dL (ref 3.6–5.1)
Alkaline phosphatase (APISO): 75 U/L (ref 35–144)
BUN: 22 mg/dL (ref 7–25)
CO2: 23 mmol/L (ref 20–32)
Calcium: 9.6 mg/dL (ref 8.6–10.3)
Chloride: 107 mmol/L (ref 98–110)
Creat: 1.02 mg/dL (ref 0.70–1.30)
Globulin: 2.1 g/dL (ref 1.9–3.7)
Glucose, Bld: 98 mg/dL (ref 65–99)
Potassium: 4.2 mmol/L (ref 3.5–5.3)
Sodium: 140 mmol/L (ref 135–146)
Total Bilirubin: 0.4 mg/dL (ref 0.2–1.2)
Total Protein: 7 g/dL (ref 6.1–8.1)

## 2023-11-18 LAB — CBC WITH DIFFERENTIAL/PLATELET
Absolute Lymphocytes: 1842 {cells}/uL (ref 850–3900)
Absolute Monocytes: 654 {cells}/uL (ref 200–950)
Basophils Absolute: 42 {cells}/uL (ref 0–200)
Basophils Relative: 0.7 %
Eosinophils Absolute: 282 {cells}/uL (ref 15–500)
Eosinophils Relative: 4.7 %
HCT: 47.7 % (ref 38.5–50.0)
Hemoglobin: 16.1 g/dL (ref 13.2–17.1)
MCH: 30.6 pg (ref 27.0–33.0)
MCHC: 33.8 g/dL (ref 32.0–36.0)
MCV: 90.5 fL (ref 80.0–100.0)
MPV: 9.8 fL (ref 7.5–12.5)
Monocytes Relative: 10.9 %
Neutro Abs: 3180 {cells}/uL (ref 1500–7800)
Neutrophils Relative %: 53 %
Platelets: 275 10*3/uL (ref 140–400)
RBC: 5.27 10*6/uL (ref 4.20–5.80)
RDW: 12.6 % (ref 11.0–15.0)
Total Lymphocyte: 30.7 %
WBC: 6 10*3/uL (ref 3.8–10.8)

## 2023-11-18 LAB — CYTOLOGY, (ORAL, ANAL, URETHRAL) ANCILLARY ONLY
Chlamydia: NEGATIVE
Chlamydia: NEGATIVE
Comment: NEGATIVE
Comment: NEGATIVE
Comment: NORMAL
Comment: NORMAL
Neisseria Gonorrhea: NEGATIVE
Neisseria Gonorrhea: NEGATIVE

## 2023-11-18 LAB — URINE CYTOLOGY ANCILLARY ONLY
Chlamydia: NEGATIVE
Comment: NEGATIVE
Comment: NORMAL
Neisseria Gonorrhea: NEGATIVE

## 2023-11-18 LAB — RPR: RPR Ser Ql: NONREACTIVE

## 2023-11-18 LAB — LIPID PANEL
Cholesterol: 124 mg/dL (ref <200)
HDL: 48 mg/dL (ref >=40)
LDL Cholesterol (Calc): 48 mg/dL
Non-HDL Cholesterol (Calc): 76 mg/dL (ref <130)
Total CHOL/HDL Ratio: 2.6 (calc) (ref <5.0)
Triglycerides: 222 mg/dL — ABNORMAL HIGH (ref <150)

## 2023-11-18 LAB — HIV-1 RNA QUANT-NO REFLEX-BLD
HIV 1 RNA Quant: NOT DETECTED {copies}/mL
HIV-1 RNA Quant, Log: NOT DETECTED {Log_copies}/mL

## 2023-11-18 NOTE — Progress Notes (Signed)
 Specialty Pharmacy Ongoing Clinical Assessment Note  F-X Edward Gillespie is a 58 y.o. male who is being followed by the specialty pharmacy service for RxSp HIV   Patient's specialty medication(s) reviewed today: Bictegravir-Emtricitab-Tenofov (BIKTARVY )   Missed doses in the last 4 weeks: 0   Patient/Caregiver did not have any additional questions or concerns.   Therapeutic benefit summary: Patient is achieving benefit   Adverse events/side effects summary: No adverse events/side effects   Patient's therapy is appropriate to: Continue    Goals Addressed             This Visit's Progress    Achieve Undetectable HIV Viral Load < 20   On track    Patient is on track. Patient will maintain adherence. Patient's viral load remains undetectable long term.      Comply with lab assessments   On track    Patient is on track. Patient will adhere to provider and/or lab appointments.      Minimize and address adverse drug events/drug interactions   On track    Patient is on track. Patient will be monitored by provider to determine if a change in treatment plan is warranted.         Follow up: 12 months  [facility]

## 2023-11-18 NOTE — Progress Notes (Signed)
 Specialty Pharmacy Refill Coordination Note  Spoke to patient's husband, Edward Gillespie is a 58 y.o. male contacted today regarding refills of specialty medication(s) Bictegravir-Emtricitab-Tenofov (BIKTARVY )   Patient requested Delivery   Delivery date: 11/26/23   Verified address: [address] [place], [place], [place] [address]   Medication will be filled on 11/25/23.

## 2023-11-20 ENCOUNTER — Other Ambulatory Visit (HOSPITAL_COMMUNITY): Payer: Self-pay

## 2023-11-28 ENCOUNTER — Other Ambulatory Visit (HOSPITAL_COMMUNITY): Payer: Self-pay

## 2023-11-30 ENCOUNTER — Other Ambulatory Visit: Payer: Self-pay

## 2023-11-30 ENCOUNTER — Other Ambulatory Visit (HOSPITAL_COMMUNITY): Payer: Self-pay

## 2023-11-30 ENCOUNTER — Other Ambulatory Visit (HOSPITAL_BASED_OUTPATIENT_CLINIC_OR_DEPARTMENT_OTHER): Payer: Self-pay

## 2023-12-15 ENCOUNTER — Encounter (INDEPENDENT_AMBULATORY_CARE_PROVIDER_SITE_OTHER): Payer: Self-pay

## 2023-12-16 ENCOUNTER — Other Ambulatory Visit: Payer: Self-pay

## 2023-12-16 NOTE — Progress Notes (Signed)
 Specialty Pharmacy Refill Coordination Note  Edward Gillespie is a 58 y.o. male contacted today regarding refills of specialty medication(s) Bictegravir-Emtricitab-Tenofov (BIKTARVY )   Patient requested (Patient-Rptd) Delivery   Delivery date: 12/23/23 (refill too soon until 7/8, sent mychart message to inform)   Verified address: (Patient-Rptd) [address]   Medication will be filled on 12/22/23.

## 2023-12-28 ENCOUNTER — Other Ambulatory Visit (HOSPITAL_COMMUNITY): Payer: Self-pay

## 2024-01-11 ENCOUNTER — Other Ambulatory Visit: Payer: Self-pay

## 2024-01-12 ENCOUNTER — Other Ambulatory Visit: Payer: Self-pay

## 2024-01-19 ENCOUNTER — Other Ambulatory Visit: Payer: Self-pay

## 2024-01-21 ENCOUNTER — Other Ambulatory Visit: Payer: Self-pay

## 2024-01-21 ENCOUNTER — Other Ambulatory Visit (HOSPITAL_COMMUNITY): Payer: Self-pay

## 2024-01-21 NOTE — Progress Notes (Signed)
 Specialty Pharmacy Refill Coordination Note  F-X Hercules Hasler is a 58 y.o. male contacted today regarding refills of specialty medication(s) Bictegravir-Emtricitab-Tenofov (BIKTARVY )   Patient requested Delivery   Delivery date: 01/22/24   Verified address: [address] [place] [address]   Medication will be filled on 01/21/24.

## 2024-02-12 ENCOUNTER — Encounter (INDEPENDENT_AMBULATORY_CARE_PROVIDER_SITE_OTHER): Payer: Self-pay

## 2024-02-13 ENCOUNTER — Other Ambulatory Visit (HOSPITAL_COMMUNITY): Payer: Self-pay

## 2024-02-16 ENCOUNTER — Other Ambulatory Visit: Payer: Self-pay | Admitting: Pharmacy Technician

## 2024-02-16 ENCOUNTER — Other Ambulatory Visit: Payer: Self-pay

## 2024-02-16 ENCOUNTER — Other Ambulatory Visit (HOSPITAL_COMMUNITY): Payer: Self-pay

## 2024-02-18 ENCOUNTER — Other Ambulatory Visit (HOSPITAL_COMMUNITY): Payer: Self-pay

## 2024-02-19 ENCOUNTER — Other Ambulatory Visit: Payer: Self-pay

## 2024-02-22 ENCOUNTER — Other Ambulatory Visit: Payer: Self-pay

## 2024-02-23 ENCOUNTER — Other Ambulatory Visit: Payer: Self-pay

## 2024-03-02 ENCOUNTER — Other Ambulatory Visit: Payer: Self-pay

## 2024-03-03 ENCOUNTER — Other Ambulatory Visit: Payer: Self-pay

## 2024-03-04 ENCOUNTER — Other Ambulatory Visit: Payer: Self-pay

## 2024-03-07 ENCOUNTER — Other Ambulatory Visit: Payer: Self-pay

## 2024-03-07 ENCOUNTER — Telehealth: Payer: Self-pay

## 2024-03-07 ENCOUNTER — Other Ambulatory Visit (HOSPITAL_COMMUNITY): Payer: Self-pay

## 2024-03-07 ENCOUNTER — Other Ambulatory Visit: Payer: Self-pay | Admitting: Infectious Disease

## 2024-03-07 NOTE — Telephone Encounter (Signed)
 [facility] Patient Advocate Encounter   Patient has been approved for [employer] Advancing Access Patient Assistance Program for Biktarvy   from 03/07/24 to 03/07/25.  This assistance will make the patient's copay 0.00.  I have spoken with the patient, and explained to them they will get there 1st fill from [facility] and going forward [facility] will be filling his medication.        Patient knows to call the office with questions or concerns.  Arland Hutchinson, CPhT Specialty Pharmacy Patient [facility] Phone: [phone] Fax: [phone] 03/07/2024 3:54 PM

## 2024-03-08 ENCOUNTER — Other Ambulatory Visit (HOSPITAL_COMMUNITY): Payer: Self-pay

## 2024-03-08 ENCOUNTER — Other Ambulatory Visit: Payer: Self-pay

## 2024-03-10 ENCOUNTER — Other Ambulatory Visit: Payer: Self-pay

## 2024-03-10 NOTE — Telephone Encounter (Signed)
 Patient has PCP.   Chaska Hagger, BSN, RN

## 2024-03-31 ENCOUNTER — Other Ambulatory Visit (HOSPITAL_COMMUNITY): Payer: Self-pay

## 2024-04-04 ENCOUNTER — Other Ambulatory Visit: Payer: Self-pay

## 2024-04-04 ENCOUNTER — Other Ambulatory Visit (HOSPITAL_COMMUNITY): Payer: Self-pay

## 2024-04-04 NOTE — Progress Notes (Signed)
 Biktarvy  will need to be filled with [facility] Advancing Access)

## 2024-04-15 ENCOUNTER — Telehealth: Payer: Self-pay

## 2024-04-15 DIAGNOSIS — B20 Human immunodeficiency virus [HIV] disease: Secondary | ICD-10-CM

## 2024-04-15 NOTE — Telephone Encounter (Signed)
 FYI Dr. Fleeta Rothman

## 2024-04-15 NOTE — Telephone Encounter (Signed)
 [facility] Patient Advocate Encounter  [facility] specialty pharmacy and I have been unsuccessful in reaching patient to be able to refill medication.    We have tried multiple times without a response.  Patient will need to contact [facility] to have there Biktarvy  filled [phone]  Edward Gillespie, CPhT Specialty Pharmacy Patient [facility] Phone: [phone] Fax:  [phone]

## 2024-04-15 NOTE — Telephone Encounter (Signed)
 Will consider this with the help of triage team next week. He last filled through [facility] in September, so he should have only been out of medication for a week or so at this point.

## 2024-04-18 NOTE — Addendum Note (Signed)
 Addended by: Kourtnee Lahey M on: 04/18/2024 10:01 AM   Modules accepted: Orders

## 2024-04-21 ENCOUNTER — Encounter: Payer: Self-pay | Admitting: Infectious Disease

## 2024-04-21 ENCOUNTER — Other Ambulatory Visit: Payer: Self-pay | Admitting: Infectious Disease

## 2024-04-21 ENCOUNTER — Other Ambulatory Visit (HOSPITAL_COMMUNITY): Payer: Self-pay

## 2024-04-22 ENCOUNTER — Other Ambulatory Visit: Payer: Self-pay

## 2024-04-26 ENCOUNTER — Other Ambulatory Visit (HOSPITAL_COMMUNITY): Payer: Self-pay

## 2024-04-26 MED ORDER — TRAZODONE HCL 50 MG PO TABS
25.0000 mg | ORAL_TABLET | Freq: Every evening | ORAL | 11 refills | Status: AC | PRN
  Filled 2024-04-26: qty 30, 30d supply, fill #0
  Filled 2024-05-30: qty 30, 30d supply, fill #1
  Filled 2024-05-31 – 2024-06-12 (×2): qty 30, 30d supply, fill #2
  Filled 2024-06-28: qty 30, 30d supply, fill #3
  Filled 2024-06-28: qty 30, 30d supply, fill #2

## 2024-04-27 ENCOUNTER — Telehealth: Payer: Self-pay

## 2024-04-27 ENCOUNTER — Telehealth: Payer: Self-pay | Admitting: Infectious Disease

## 2024-04-27 NOTE — Telephone Encounter (Signed)
 [name] LVM requesting an urgent call from Dr. Fleeta Rothman. Phone connection was poor so I was unable to understand the reasoning. He stated he would need information for a provider in [place]. Pt can be reached at [phone]

## 2024-04-27 NOTE — Telephone Encounter (Addendum)
 Patient called from [facility] inquiring about Yellow Fever vaccine. Pt will be traveling to [place], and was told he would need YF vaccine as a travel requirement. Raquel, RN states that they need approval from ID standpoint before they can administer this. Spoke with Dr. Overton who did confirm pt is okay with proceeding with vaccine. No contraindications. Rn and pt verbalized understanding. No further questions at this time.  Rn called back stating to ask if pt is okay to take oral typhoid. Per Dr Overton okay to take medication.  Lorenda CHRISTELLA Code, RMA

## 2024-05-03 ENCOUNTER — Other Ambulatory Visit (HOSPITAL_COMMUNITY): Payer: Self-pay

## 2024-05-03 ENCOUNTER — Other Ambulatory Visit: Payer: Self-pay

## 2024-05-30 ENCOUNTER — Other Ambulatory Visit: Payer: Self-pay

## 2024-05-30 ENCOUNTER — Encounter: Payer: Self-pay | Admitting: Infectious Disease

## 2024-05-30 ENCOUNTER — Other Ambulatory Visit: Payer: Self-pay | Admitting: Infectious Disease

## 2024-05-30 ENCOUNTER — Ambulatory Visit: Payer: Self-pay | Admitting: Infectious Disease

## 2024-05-30 ENCOUNTER — Other Ambulatory Visit (HOSPITAL_COMMUNITY): Payer: Self-pay

## 2024-05-30 ENCOUNTER — Other Ambulatory Visit (HOSPITAL_COMMUNITY)
Admission: RE | Admit: 2024-05-30 | Discharge: 2024-05-30 | Disposition: A | Payer: PRIVATE HEALTH INSURANCE | Source: Ambulatory Visit | Attending: Infectious Disease | Admitting: Infectious Disease

## 2024-05-30 VITALS — BP 141/95 | HR 74 | Temp 97.6°F | Ht 68.5 in | Wt 236.0 lb

## 2024-05-30 DIAGNOSIS — I10 Essential (primary) hypertension: Secondary | ICD-10-CM | POA: Diagnosis not present

## 2024-05-30 DIAGNOSIS — B20 Human immunodeficiency virus [HIV] disease: Secondary | ICD-10-CM

## 2024-05-30 DIAGNOSIS — K6282 Dysplasia of anus: Secondary | ICD-10-CM | POA: Diagnosis not present

## 2024-05-30 DIAGNOSIS — E785 Hyperlipidemia, unspecified: Secondary | ICD-10-CM | POA: Diagnosis present

## 2024-05-30 DIAGNOSIS — Z21 Asymptomatic human immunodeficiency virus [HIV] infection status: Secondary | ICD-10-CM

## 2024-05-30 MED ORDER — BICTEGRAVIR-EMTRICITAB-TENOFOV 50-200-25 MG PO TABS: 1.0000 | ORAL_TABLET | Freq: Every day | ORAL | 3 refills | Status: AC

## 2024-05-30 MED ORDER — BICTEGRAVIR-EMTRICITAB-TENOFOV 50-200-25 MG PO TABS: 1.0000 | ORAL_TABLET | Freq: Every day | ORAL | 11 refills | Status: DC

## 2024-05-30 MED ORDER — ATORVASTATIN CALCIUM 40 MG PO TABS
40.0000 mg | ORAL_TABLET | Freq: Every day | ORAL | 11 refills | Status: AC
  Filled 2024-05-30: qty 30, 30d supply, fill #0
  Filled 2024-05-31 – 2024-07-03 (×2): qty 30, 30d supply, fill #1

## 2024-05-30 NOTE — Telephone Encounter (Signed)
 Appt 12/15  Duwaine Lowe, BSN, RN
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
## 2024-05-30 NOTE — Patient Instructions (Signed)
°   I want you to have an anoscopy performed by Dr. Bernarda Ned with [facility]  Her address and phone number are below  Address: [address], [place], [place] [address] Phone: [phone]

## 2024-05-31 ENCOUNTER — Other Ambulatory Visit: Payer: Self-pay

## 2024-05-31 ENCOUNTER — Other Ambulatory Visit (HOSPITAL_COMMUNITY): Payer: Self-pay

## 2024-05-31 ENCOUNTER — Other Ambulatory Visit: Payer: Self-pay | Admitting: Pharmacist

## 2024-05-31 DIAGNOSIS — Z21 Asymptomatic human immunodeficiency virus [HIV] infection status: Secondary | ICD-10-CM

## 2024-05-31 LAB — T-HELPER CELLS (CD4) COUNT (NOT AT ARMC)
CD4 % Helper T Cell: 37 % (ref 33–65)
CD4 T Cell Abs: 372 /uL — ABNORMAL LOW (ref 400–1790)

## 2024-05-31 MED ORDER — BIKTARVY 50-200-25 MG PO TABS: 1.0000 | ORAL_TABLET | Freq: Every day | ORAL | Status: AC

## 2024-05-31 NOTE — Progress Notes (Signed)
 Medication Samples have been provided to the patient.  Drug name: Biktarvy         Strength: 50/200/25 mg       Qty: 4 bottles (28 tablets)   LOT: [id number]   Exp.Date: 07/16/26  Samples requested by Dr. Fleeta Rothman.  Dosing instructions: Take one tablet by mouth once daily  The patient has been instructed regarding the correct time, dose, and frequency of taking this medication, including desired effects and most common side effects.   Coleen Cardiff L. Kirin Brandenburger, PharmD, BCIDP, AAHIVP, CPP Clinical Pharmacist Practitioner Infectious Diseases Clinical Pharmacist [facility]

## 2024-06-01 LAB — CYTOLOGY, (ORAL, ANAL, URETHRAL) ANCILLARY ONLY
Chlamydia: NEGATIVE
Chlamydia: NEGATIVE
Comment: NEGATIVE
Comment: NEGATIVE
Comment: NORMAL
Comment: NORMAL
Neisseria Gonorrhea: NEGATIVE
Neisseria Gonorrhea: NEGATIVE

## 2024-06-01 LAB — CBC WITH DIFFERENTIAL/PLATELET
Absolute Lymphocytes: 1065 {cells}/uL (ref 850–3900)
Absolute Monocytes: 845 {cells}/uL (ref 200–950)
Basophils Absolute: 50 {cells}/uL (ref 0–200)
Basophils Relative: 1 %
Eosinophils Absolute: 260 {cells}/uL (ref 15–500)
Eosinophils Relative: 5.2 %
HCT: 51 % (ref 39.4–51.1)
Hemoglobin: 16.9 g/dL (ref 13.2–17.1)
MCH: 29.7 pg (ref 27.0–33.0)
MCHC: 33.1 g/dL (ref 31.6–35.4)
MCV: 89.6 fL (ref 81.4–101.7)
MPV: 9.7 fL (ref 7.5–12.5)
Monocytes Relative: 16.9 %
Neutro Abs: 2780 {cells}/uL (ref 1500–7800)
Neutrophils Relative %: 55.6 %
Platelets: 231 Thousand/uL (ref 140–400)
RBC: 5.69 Million/uL (ref 4.20–5.80)
RDW: 12.6 % (ref 11.0–15.0)
Total Lymphocyte: 21.3 %
WBC: 5 Thousand/uL (ref 3.8–10.8)

## 2024-06-01 LAB — COMPLETE METABOLIC PANEL WITHOUT GFR
AG Ratio: 1.9 (calc) (ref 1.0–2.5)
ALT: 68 U/L — ABNORMAL HIGH (ref 9–46)
AST: 35 U/L (ref 10–35)
Albumin: 4.3 g/dL (ref 3.6–5.1)
Alkaline phosphatase (APISO): 74 U/L (ref 35–144)
BUN/Creatinine Ratio: 11 (calc) (ref 6–22)
BUN: 14 mg/dL (ref 7–25)
CO2: 25 mmol/L (ref 20–32)
Calcium: 8.8 mg/dL (ref 8.6–10.3)
Chloride: 105 mmol/L (ref 98–110)
Creat: 1.31 mg/dL — ABNORMAL HIGH (ref 0.70–1.30)
Globulin: 2.3 g/dL (ref 1.9–3.7)
Glucose, Bld: 105 mg/dL — ABNORMAL HIGH (ref 65–99)
Potassium: 4 mmol/L (ref 3.5–5.3)
Sodium: 139 mmol/L (ref 135–146)
Total Bilirubin: 0.5 mg/dL (ref 0.2–1.2)
Total Protein: 6.6 g/dL (ref 6.1–8.1)

## 2024-06-01 LAB — LIPID PANEL
Cholesterol: 129 mg/dL (ref <200)
HDL: 34 mg/dL — ABNORMAL LOW (ref >=40)
LDL Cholesterol (Calc): 65 mg/dL
Non-HDL Cholesterol (Calc): 95 mg/dL (ref <130)
Total CHOL/HDL Ratio: 3.8 (calc) (ref <5.0)
Triglycerides: 237 mg/dL — ABNORMAL HIGH (ref <150)

## 2024-06-01 LAB — URINE CYTOLOGY ANCILLARY ONLY
Chlamydia: NEGATIVE
Comment: NEGATIVE
Comment: NORMAL
Neisseria Gonorrhea: NEGATIVE

## 2024-06-01 LAB — HIV-1 RNA QUANT-NO REFLEX-BLD
HIV 1 RNA Quant: NOT DETECTED {copies}/mL
HIV-1 RNA Quant, Log: NOT DETECTED {Log_copies}/mL

## 2024-06-01 LAB — SYPHILIS: RPR W/REFLEX TO RPR TITER AND TREPONEMAL ANTIBODIES, TRADITIONAL SCREENING AND DIAGNOSIS ALGORITHM: RPR Ser Ql: NONREACTIVE

## 2024-06-01 MED ORDER — DOXYCYCLINE HYCLATE 100 MG PO TABS
ORAL_TABLET | ORAL | 1 refills | Status: AC
  Filled 2024-06-01: qty 20, fill #0
  Filled 2024-06-12: qty 20, 10d supply, fill #1

## 2024-06-02 ENCOUNTER — Other Ambulatory Visit: Payer: Self-pay

## 2024-06-02 ENCOUNTER — Other Ambulatory Visit (HOSPITAL_BASED_OUTPATIENT_CLINIC_OR_DEPARTMENT_OTHER): Payer: Self-pay

## 2024-06-03 ENCOUNTER — Other Ambulatory Visit (HOSPITAL_COMMUNITY): Payer: Self-pay

## 2024-06-03 ENCOUNTER — Other Ambulatory Visit: Payer: Self-pay

## 2024-06-03 ENCOUNTER — Encounter: Payer: Self-pay | Admitting: Pharmacist

## 2024-06-07 ENCOUNTER — Other Ambulatory Visit (HOSPITAL_COMMUNITY): Payer: Self-pay

## 2024-06-07 ENCOUNTER — Other Ambulatory Visit: Payer: Self-pay

## 2024-06-13 ENCOUNTER — Other Ambulatory Visit: Payer: Self-pay

## 2024-06-13 ENCOUNTER — Other Ambulatory Visit (HOSPITAL_COMMUNITY): Payer: Self-pay

## 2024-06-28 ENCOUNTER — Other Ambulatory Visit (HOSPITAL_COMMUNITY): Payer: Self-pay

## 2024-06-29 ENCOUNTER — Other Ambulatory Visit (HOSPITAL_COMMUNITY): Payer: Self-pay

## 2024-07-03 ENCOUNTER — Other Ambulatory Visit (HOSPITAL_COMMUNITY): Payer: Self-pay

## 2024-07-04 ENCOUNTER — Other Ambulatory Visit (HOSPITAL_COMMUNITY): Payer: Self-pay

## 2024-07-04 ENCOUNTER — Other Ambulatory Visit: Payer: Self-pay

## 2024-11-28 ENCOUNTER — Ambulatory Visit: Payer: Self-pay | Admitting: Infectious Disease
